# Patient Record
Sex: Female | Born: 1945
Health system: Southern US, Community
[De-identification: ages and names within clinical notes are randomized; demographics above are authoritative.]

## PROBLEM LIST (undated history)

## (undated) DIAGNOSIS — Z8709 Personal history of other diseases of the respiratory system: Secondary | ICD-10-CM

## (undated) DIAGNOSIS — K219 Gastro-esophageal reflux disease without esophagitis: Secondary | ICD-10-CM

## (undated) DIAGNOSIS — Z860100 Personal history of colon polyps, unspecified: Secondary | ICD-10-CM

## (undated) DIAGNOSIS — M1990 Unspecified osteoarthritis, unspecified site: Secondary | ICD-10-CM

## (undated) DIAGNOSIS — R928 Other abnormal and inconclusive findings on diagnostic imaging of breast: Secondary | ICD-10-CM

## (undated) DIAGNOSIS — Z87442 Personal history of urinary calculi: Secondary | ICD-10-CM

## (undated) DIAGNOSIS — K602 Anal fissure, unspecified: Secondary | ICD-10-CM

## (undated) HISTORY — PX: CYSTOSCOPY W/ STONE MANIPULATION: SHX1427

## (undated) HISTORY — PX: SOFT TISSUE BIOPSY: SHX1053

## (undated) HISTORY — PX: BREAST EXCISIONAL BIOPSY: SUR124

---

## 1997-09-06 ENCOUNTER — Other Ambulatory Visit: Admission: RE | Admit: 1997-09-06 | Discharge: 1997-09-06 | Payer: Self-pay | Admitting: Obstetrics and Gynecology

## 1998-11-08 ENCOUNTER — Other Ambulatory Visit: Admission: RE | Admit: 1998-11-08 | Discharge: 1998-11-08 | Payer: Self-pay | Admitting: Obstetrics and Gynecology

## 1998-11-28 ENCOUNTER — Ambulatory Visit (HOSPITAL_BASED_OUTPATIENT_CLINIC_OR_DEPARTMENT_OTHER): Admission: RE | Admit: 1998-11-28 | Discharge: 1998-11-28 | Payer: Self-pay | Admitting: Surgery

## 1999-12-13 ENCOUNTER — Other Ambulatory Visit: Admission: RE | Admit: 1999-12-13 | Discharge: 1999-12-13 | Payer: Self-pay | Admitting: Obstetrics and Gynecology

## 2001-01-05 ENCOUNTER — Encounter: Admission: RE | Admit: 2001-01-05 | Discharge: 2001-01-05 | Payer: Self-pay | Admitting: Obstetrics and Gynecology

## 2001-01-05 ENCOUNTER — Encounter: Payer: Self-pay | Admitting: Obstetrics and Gynecology

## 2004-01-15 ENCOUNTER — Other Ambulatory Visit: Admission: RE | Admit: 2004-01-15 | Discharge: 2004-01-15 | Payer: Self-pay | Admitting: Obstetrics and Gynecology

## 2004-01-24 ENCOUNTER — Encounter: Admission: RE | Admit: 2004-01-24 | Discharge: 2004-01-24 | Payer: Self-pay | Admitting: Obstetrics and Gynecology

## 2004-09-05 ENCOUNTER — Encounter: Admission: RE | Admit: 2004-09-05 | Discharge: 2004-09-05 | Payer: Self-pay | Admitting: Obstetrics and Gynecology

## 2005-02-21 ENCOUNTER — Other Ambulatory Visit
Admission: RE | Admit: 2005-02-21 | Discharge: 2005-02-21 | Payer: Self-pay | Admitting: Physical Medicine & Rehabilitation

## 2005-10-07 ENCOUNTER — Encounter: Admission: RE | Admit: 2005-10-07 | Discharge: 2005-10-07 | Payer: Self-pay | Admitting: Obstetrics and Gynecology

## 2005-10-28 ENCOUNTER — Encounter: Admission: RE | Admit: 2005-10-28 | Discharge: 2005-10-28 | Payer: Self-pay | Admitting: Obstetrics and Gynecology

## 2006-02-25 ENCOUNTER — Other Ambulatory Visit: Admission: RE | Admit: 2006-02-25 | Discharge: 2006-02-25 | Payer: Self-pay | Admitting: Obstetrics and Gynecology

## 2006-05-04 ENCOUNTER — Encounter: Admission: RE | Admit: 2006-05-04 | Discharge: 2006-05-04 | Payer: Self-pay | Admitting: Obstetrics and Gynecology

## 2006-10-14 ENCOUNTER — Encounter: Admission: RE | Admit: 2006-10-14 | Discharge: 2006-10-14 | Payer: Self-pay | Admitting: Obstetrics and Gynecology

## 2007-03-02 ENCOUNTER — Other Ambulatory Visit: Admission: RE | Admit: 2007-03-02 | Discharge: 2007-03-02 | Payer: Self-pay | Admitting: Obstetrics and Gynecology

## 2007-10-26 ENCOUNTER — Encounter: Admission: RE | Admit: 2007-10-26 | Discharge: 2007-10-26 | Payer: Self-pay | Admitting: Obstetrics and Gynecology

## 2007-11-03 ENCOUNTER — Encounter: Admission: RE | Admit: 2007-11-03 | Discharge: 2007-11-03 | Payer: Self-pay | Admitting: Obstetrics and Gynecology

## 2008-03-02 ENCOUNTER — Other Ambulatory Visit: Admission: RE | Admit: 2008-03-02 | Discharge: 2008-03-02 | Payer: Self-pay | Admitting: Obstetrics and Gynecology

## 2008-10-30 ENCOUNTER — Encounter: Admission: RE | Admit: 2008-10-30 | Discharge: 2008-10-30 | Payer: Self-pay | Admitting: Obstetrics and Gynecology

## 2009-03-05 ENCOUNTER — Other Ambulatory Visit: Admission: RE | Admit: 2009-03-05 | Discharge: 2009-03-05 | Payer: Self-pay | Admitting: Obstetrics and Gynecology

## 2009-04-26 ENCOUNTER — Ambulatory Visit (HOSPITAL_COMMUNITY): Admission: RE | Admit: 2009-04-26 | Discharge: 2009-04-26 | Payer: Self-pay | Admitting: Gastroenterology

## 2009-07-20 ENCOUNTER — Ambulatory Visit: Payer: Self-pay | Admitting: Diagnostic Radiology

## 2009-07-20 ENCOUNTER — Emergency Department (HOSPITAL_BASED_OUTPATIENT_CLINIC_OR_DEPARTMENT_OTHER): Admission: EM | Admit: 2009-07-20 | Discharge: 2009-07-20 | Payer: Self-pay | Admitting: Emergency Medicine

## 2009-07-21 ENCOUNTER — Emergency Department (HOSPITAL_BASED_OUTPATIENT_CLINIC_OR_DEPARTMENT_OTHER): Admission: EM | Admit: 2009-07-21 | Discharge: 2009-07-21 | Payer: Self-pay | Admitting: Emergency Medicine

## 2009-07-22 ENCOUNTER — Inpatient Hospital Stay (HOSPITAL_COMMUNITY): Admission: EM | Admit: 2009-07-22 | Discharge: 2009-07-24 | Payer: Self-pay | Admitting: Emergency Medicine

## 2009-12-03 ENCOUNTER — Encounter: Admission: RE | Admit: 2009-12-03 | Discharge: 2009-12-03 | Payer: Self-pay | Admitting: Internal Medicine

## 2010-03-22 ENCOUNTER — Other Ambulatory Visit
Admission: RE | Admit: 2010-03-22 | Discharge: 2010-03-22 | Payer: Self-pay | Source: Home / Self Care | Admitting: Obstetrics and Gynecology

## 2010-05-13 ENCOUNTER — Encounter: Payer: Self-pay | Admitting: Obstetrics and Gynecology

## 2010-07-10 LAB — DIFFERENTIAL
Basophils Absolute: 0 10*3/uL (ref 0.0–0.1)
Basophils Absolute: 0 10*3/uL (ref 0.0–0.1)
Basophils Relative: 0 % (ref 0–1)
Eosinophils Absolute: 0 10*3/uL (ref 0.0–0.7)
Eosinophils Absolute: 0 10*3/uL (ref 0.0–0.7)
Eosinophils Relative: 0 % (ref 0–5)
Eosinophils Relative: 0 % (ref 0–5)
Eosinophils Relative: 0 % (ref 0–5)
Lymphocytes Relative: 10 % — ABNORMAL LOW (ref 12–46)
Lymphocytes Relative: 15 % (ref 12–46)
Lymphs Abs: 0.6 10*3/uL — ABNORMAL LOW (ref 0.7–4.0)
Lymphs Abs: 1.3 10*3/uL (ref 0.7–4.0)
Lymphs Abs: 1.5 10*3/uL (ref 0.7–4.0)
Monocytes Absolute: 0.7 10*3/uL (ref 0.1–1.0)
Monocytes Absolute: 1 10*3/uL (ref 0.1–1.0)
Monocytes Absolute: 0.5 10*3/uL (ref 0.1–1.0)
Monocytes Relative: 8 % (ref 3–12)
Monocytes Relative: 5 % (ref 3–12)
Neutro Abs: 10.4 10*3/uL — ABNORMAL HIGH (ref 1.7–7.7)
Neutro Abs: 7.7 10*3/uL (ref 1.7–7.7)
Neutrophils Relative %: 82 % — ABNORMAL HIGH (ref 43–77)

## 2010-07-10 LAB — COMPREHENSIVE METABOLIC PANEL
AST: 23 U/L (ref 0–37)
Albumin: 4 g/dL (ref 3.5–5.2)
BUN: 16 mg/dL (ref 6–23)
Calcium: 9.6 mg/dL (ref 8.4–10.5)
Chloride: 106 mEq/L (ref 96–112)
Creatinine, Ser: 0.8 mg/dL (ref 0.4–1.2)
GFR calc Af Amer: >60 mL/min (ref >60)
Total Protein: 7 g/dL (ref 6.0–8.3)

## 2010-07-10 LAB — BASIC METABOLIC PANEL
BUN: 12 mg/dL (ref 6–23)
BUN: 17 mg/dL (ref 6–23)
CO2: 25 mEq/L (ref 19–32)
CO2: 25 mEq/L (ref 19–32)
Calcium: 8.5 mg/dL (ref 8.4–10.5)
Calcium: 8.7 mg/dL (ref 8.4–10.5)
Chloride: 103 mEq/L (ref 96–112)
Chloride: 109 mEq/L (ref 96–112)
Creatinine, Ser: 1.26 mg/dL — ABNORMAL HIGH (ref 0.4–1.2)
Creatinine, Ser: 1.3 mg/dL — ABNORMAL HIGH (ref 0.4–1.2)
GFR calc Af Amer: 50 mL/min — ABNORMAL LOW (ref >60)
GFR calc Af Amer: 52 mL/min — ABNORMAL LOW (ref >60)
GFR calc non Af Amer: 41 mL/min — ABNORMAL LOW (ref >60)
GFR calc non Af Amer: 43 mL/min — ABNORMAL LOW (ref >60)
Glucose, Bld: 116 mg/dL — ABNORMAL HIGH (ref 70–99)
Glucose, Bld: 145 mg/dL — ABNORMAL HIGH (ref 70–99)
Potassium: 4 mEq/L (ref 3.5–5.1)
Potassium: 4.1 mEq/L (ref 3.5–5.1)
Sodium: 133 mEq/L — ABNORMAL LOW (ref 135–145)
Sodium: 138 mEq/L (ref 135–145)

## 2010-07-10 LAB — URINALYSIS, ROUTINE W REFLEX MICROSCOPIC
Bilirubin Urine: NEGATIVE
Bilirubin Urine: NEGATIVE
Glucose, UA: NEGATIVE mg/dL
Glucose, UA: NEGATIVE mg/dL
Ketones, ur: 15 mg/dL — AB
Ketones, ur: NEGATIVE mg/dL
Leukocytes, UA: NEGATIVE
Nitrite: NEGATIVE
Nitrite: NEGATIVE
Protein, ur: NEGATIVE mg/dL
Specific Gravity, Urine: 1.012 (ref 1.005–1.030)
Specific Gravity, Urine: 1.039 — ABNORMAL HIGH (ref 1.005–1.030)
Urobilinogen, UA: 0.2 mg/dL (ref 0.0–1.0)
pH: 6 (ref 5.0–8.0)
pH: 6 (ref 5.0–8.0)

## 2010-07-10 LAB — CBC
HCT: 32.7 % — ABNORMAL LOW (ref 36.0–46.0)
HCT: 33.5 % — ABNORMAL LOW (ref 36.0–46.0)
HCT: 37.6 % (ref 36.0–46.0)
HCT: 39.7 % (ref 36.0–46.0)
Hemoglobin: 10.8 g/dL — ABNORMAL LOW (ref 12.0–15.0)
Hemoglobin: 11.2 g/dL — ABNORMAL LOW (ref 12.0–15.0)
Hemoglobin: 12.4 g/dL (ref 12.0–15.0)
MCHC: 33.1 g/dL (ref 30.0–36.0)
MCHC: 33.1 g/dL (ref 30.0–36.0)
MCHC: 33.8 g/dL (ref 30.0–36.0)
MCV: 93.6 fL (ref 78.0–100.0)
MCV: 93.9 fL (ref 78.0–100.0)
MCV: 94.6 fL (ref 78.0–100.0)
MCV: 91.7 fL (ref 78.0–100.0)
Platelets: 143 10*3/uL — ABNORMAL LOW (ref 150–400)
Platelets: 150 10*3/uL (ref 150–400)
Platelets: 173 10*3/uL (ref 150–400)
Platelets: 232 10*3/uL (ref 150–400)
RBC: 3.49 MIL/uL — ABNORMAL LOW (ref 3.87–5.11)
RBC: 3.98 MIL/uL (ref 3.87–5.11)
RDW: 13.2 % (ref 11.5–15.5)
RDW: 13.5 % (ref 11.5–15.5)
RDW: 13.6 % (ref 11.5–15.5)
RDW: 13 % (ref 11.5–15.5)
WBC: 12.7 10*3/uL — ABNORMAL HIGH (ref 4.0–10.5)
WBC: 8.8 10*3/uL (ref 4.0–10.5)
WBC: 9.5 10*3/uL (ref 4.0–10.5)
WBC: 9.7 10*3/uL (ref 4.0–10.5)

## 2010-07-10 LAB — URINE MICROSCOPIC-ADD ON

## 2010-07-10 LAB — CREATININE, SERUM
Creatinine, Ser: 1.14 mg/dL (ref 0.4–1.2)
GFR calc Af Amer: 58 mL/min — ABNORMAL LOW (ref >60)
GFR calc non Af Amer: 48 mL/min — ABNORMAL LOW (ref >60)

## 2010-07-10 LAB — URINE CULTURE: Colony Count: NO GROWTH

## 2010-12-06 ENCOUNTER — Other Ambulatory Visit: Payer: Self-pay | Admitting: Obstetrics and Gynecology

## 2010-12-06 DIAGNOSIS — Z1231 Encounter for screening mammogram for malignant neoplasm of breast: Secondary | ICD-10-CM

## 2010-12-16 ENCOUNTER — Ambulatory Visit
Admission: RE | Admit: 2010-12-16 | Discharge: 2010-12-16 | Disposition: A | Discharge status: Home / Self Care | Payer: BC Managed Care – PPO | Source: Ambulatory Visit | Attending: Obstetrics and Gynecology | Admitting: Obstetrics and Gynecology

## 2010-12-16 DIAGNOSIS — Z1231 Encounter for screening mammogram for malignant neoplasm of breast: Secondary | ICD-10-CM

## 2012-01-16 ENCOUNTER — Other Ambulatory Visit: Payer: Self-pay | Admitting: Internal Medicine

## 2012-01-16 DIAGNOSIS — Z1231 Encounter for screening mammogram for malignant neoplasm of breast: Secondary | ICD-10-CM

## 2012-02-10 ENCOUNTER — Ambulatory Visit
Admission: RE | Admit: 2012-02-10 | Discharge: 2012-02-10 | Disposition: A | Discharge status: Home / Self Care | Payer: Medicare Other | Source: Ambulatory Visit | Attending: Internal Medicine | Admitting: Internal Medicine

## 2012-02-10 DIAGNOSIS — Z1231 Encounter for screening mammogram for malignant neoplasm of breast: Secondary | ICD-10-CM

## 2013-01-03 ENCOUNTER — Other Ambulatory Visit: Payer: Self-pay

## 2013-01-03 DIAGNOSIS — Z1231 Encounter for screening mammogram for malignant neoplasm of breast: Secondary | ICD-10-CM

## 2013-02-14 ENCOUNTER — Inpatient Hospital Stay: Admission: RE | Admit: 2013-02-14 | Payer: Medicare Other | Source: Ambulatory Visit

## 2013-03-21 ENCOUNTER — Ambulatory Visit
Admission: RE | Admit: 2013-03-21 | Discharge: 2013-03-21 | Disposition: A | Discharge status: Home / Self Care | Payer: Medicare Other | Source: Ambulatory Visit

## 2013-03-21 DIAGNOSIS — Z1231 Encounter for screening mammogram for malignant neoplasm of breast: Secondary | ICD-10-CM

## 2014-02-16 ENCOUNTER — Other Ambulatory Visit: Payer: Self-pay

## 2014-02-16 DIAGNOSIS — Z1231 Encounter for screening mammogram for malignant neoplasm of breast: Secondary | ICD-10-CM

## 2014-03-27 ENCOUNTER — Ambulatory Visit
Admission: RE | Admit: 2014-03-27 | Discharge: 2014-03-27 | Disposition: A | Discharge status: Home / Self Care | Payer: Medicare Other | Source: Ambulatory Visit

## 2014-03-27 ENCOUNTER — Encounter (INDEPENDENT_AMBULATORY_CARE_PROVIDER_SITE_OTHER): Payer: Self-pay

## 2014-03-27 DIAGNOSIS — Z1231 Encounter for screening mammogram for malignant neoplasm of breast: Secondary | ICD-10-CM

## 2014-09-21 ENCOUNTER — Other Ambulatory Visit: Payer: Self-pay | Admitting: Gastroenterology

## 2014-12-18 ENCOUNTER — Encounter (HOSPITAL_COMMUNITY): Payer: Self-pay | Admitting: *Deleted

## 2015-01-02 ENCOUNTER — Ambulatory Visit (HOSPITAL_COMMUNITY): Payer: PPO | Admitting: Anesthesiology

## 2015-01-02 ENCOUNTER — Ambulatory Visit (HOSPITAL_COMMUNITY)
Admission: RE | Admit: 2015-01-02 | Discharge: 2015-01-02 | Disposition: A | Discharge status: Home / Self Care | Payer: PPO | Source: Ambulatory Visit | Attending: Gastroenterology | Admitting: Gastroenterology

## 2015-01-02 ENCOUNTER — Encounter (HOSPITAL_COMMUNITY)
Admission: RE | Disposition: A | Discharge status: Home / Self Care | Payer: Self-pay | Source: Ambulatory Visit | Attending: Gastroenterology

## 2015-01-02 ENCOUNTER — Encounter (HOSPITAL_COMMUNITY): Payer: Self-pay

## 2015-01-02 DIAGNOSIS — K602 Anal fissure, unspecified: Secondary | ICD-10-CM | POA: Diagnosis not present

## 2015-01-02 DIAGNOSIS — K219 Gastro-esophageal reflux disease without esophagitis: Secondary | ICD-10-CM | POA: Insufficient documentation

## 2015-01-02 DIAGNOSIS — Z1211 Encounter for screening for malignant neoplasm of colon: Secondary | ICD-10-CM | POA: Diagnosis not present

## 2015-01-02 DIAGNOSIS — Z86010 Personal history of colonic polyps: Secondary | ICD-10-CM | POA: Insufficient documentation

## 2015-01-02 DIAGNOSIS — D123 Benign neoplasm of transverse colon: Secondary | ICD-10-CM | POA: Diagnosis not present

## 2015-01-02 HISTORY — PX: COLONOSCOPY WITH PROPOFOL: SHX5780

## 2015-01-02 HISTORY — DX: Personal history of urinary calculi: Z87.442

## 2015-01-02 SURGERY — COLONOSCOPY WITH PROPOFOL
Anesthesia: Monitor Anesthesia Care

## 2015-01-02 MED ORDER — PROPOFOL 10 MG/ML IV BOLUS
INTRAVENOUS | Status: DC | PRN
  Administered 2015-01-02 (×2): 20 mg via INTRAVENOUS

## 2015-01-02 MED ORDER — LIDOCAINE HCL (CARDIAC) 20 MG/ML IV SOLN
INTRAVENOUS | Status: AC
  Filled 2015-01-02: qty 5

## 2015-01-02 MED ORDER — PROPOFOL 10 MG/ML IV BOLUS
INTRAVENOUS | Status: AC
Start: 2015-01-02 — End: 2015-01-02
  Filled 2015-01-02: qty 20

## 2015-01-02 MED ORDER — LIDOCAINE HCL (CARDIAC) 20 MG/ML IV SOLN
INTRAVENOUS | Status: DC | PRN
  Administered 2015-01-02: 100 mg via INTRAVENOUS

## 2015-01-02 MED ORDER — PROPOFOL 10 MG/ML IV BOLUS
INTRAVENOUS | Status: AC
  Filled 2015-01-02: qty 20

## 2015-01-02 MED ORDER — SODIUM CHLORIDE 0.9 % IV SOLN: INTRAVENOUS | Status: DC

## 2015-01-02 MED ORDER — LACTATED RINGERS IV SOLN
INTRAVENOUS | Status: DC | PRN
  Administered 2015-01-02: 12:00:00 via INTRAVENOUS

## 2015-01-02 MED ORDER — PROPOFOL INFUSION 10 MG/ML OPTIME
INTRAVENOUS | Status: DC | PRN
  Administered 2015-01-02: 130 ug/kg/min via INTRAVENOUS

## 2015-01-02 SURGICAL SUPPLY — 22 items

## 2015-01-02 NOTE — Anesthesia Preprocedure Evaluation (Addendum)
Anesthesia Evaluation  Patient identified by MRN, date of birth, ID band Patient awake    Reviewed: Allergy & Precautions, H&P , NPO status , Patient's Chart, lab work & pertinent test results  Airway Mallampati: II  TM Distance: >3 FB Neck ROM: full    Dental  (+) Dental Advisory Given, Caps All upper side teeth are capped:   Pulmonary neg pulmonary ROS,    Pulmonary exam normal breath sounds clear to auscultation       Cardiovascular Exercise Tolerance: Good negative cardio ROS Normal cardiovascular exam Rhythm:regular Rate:Normal     Neuro/Psych negative neurological ROS  negative psych ROS   GI/Hepatic negative GI ROS, Neg liver ROS,   Endo/Other  negative endocrine ROS  Renal/GU negative Renal ROS  negative genitourinary   Musculoskeletal   Abdominal   Peds  Hematology negative hematology ROS (+)   Anesthesia Other Findings   Reproductive/Obstetrics negative OB ROS                            Anesthesia Physical Anesthesia Plan  ASA: I  Anesthesia Plan: MAC   Post-op Pain Management:    Induction:   Airway Management Planned: Natural Airway and Simple Face Mask  Additional Equipment:   Intra-op Plan:   Post-operative Plan:   Informed Consent: I have reviewed the patients History and Physical, chart, labs and discussed the procedure including the risks, benefits and alternatives for the proposed anesthesia with the patient or authorized representative who has indicated his/her understanding and acceptance.   Dental Advisory Given  Plan Discussed with: CRNA and Surgeon  Anesthesia Plan Comments:        Anesthesia Quick Evaluation

## 2015-01-02 NOTE — Discharge Instructions (Signed)

## 2015-01-02 NOTE — Anesthesia Postprocedure Evaluation (Signed)
  Anesthesia Post-op Note  Patient: Stacy King  Procedure(s) Performed: Procedure(s) (LRB): COLONOSCOPY WITH PROPOFOL (N/A)  Patient Location: PACU  Anesthesia Type: MAC  Level of Consciousness: awake and alert   Airway and Oxygen Therapy: Patient Spontanous Breathing  Post-op Pain: mild  Post-op Assessment: Post-op Vital signs reviewed, Patient's Cardiovascular Status Stable, Respiratory Function Stable, Patent Airway and No signs of Nausea or vomiting  Last Vitals:  Filed Vitals:   01/02/15 1400  BP: 137/73  Pulse: 59  Temp:   Resp: 17    Post-op Vital Signs: stable   Complications: No apparent anesthesia complications

## 2015-01-02 NOTE — Op Note (Signed)
Procedure: Surveillance colonoscopy. 04/26/2009 colonoscopy performed with removal of a 4 mm adenomatous ascending colon polyp  Endoscopist: Earle Gell  Premedication: Propofol administered by anesthesia  Procedure: The patient was placed in the left lateral decubitus position. Anal inspection revealed a diminutive posterior anal fissure. Digital rectal exam was normal. The Pentax pediatric colonoscope was introduced into the rectum and advanced to the cecum. A normal-appearing appendiceal orifice was identified. A normal-appearing ileocecal valve was intubated and the terminal ileum inspected. Colonic preparation for the exam today was good. Withdrawal time was 10 minutes  Rectum. Normal. Retroflex view of the distal rectum was normal  Sigmoid colon and descending colon. Normal  Splenic flexure. Normal  Transverse colon. From the proximal transverse colon, a 4 mm sessile polyp was removed with the cold snare  Ascending colon. Normal.  Cecum and ileocecal valve. Normal  Terminal ileum. Normal  Assessment: From the proximal transverse colon, a 4 mm sessile polyp was removed with the cold snare. Otherwise normal surveillance colonoscopy  Recommendation: If the proximal transverse colon polyp returns adenomatous pathologically, schedule a repeat colonoscopy in 5 years

## 2015-01-02 NOTE — Transfer of Care (Signed)
Immediate Anesthesia Transfer of Care Note  Patient: Stacy King  Procedure(s) Performed: Procedure(s): COLONOSCOPY WITH PROPOFOL (N/A)  Patient Location: PACU and Endoscopy Unit  Anesthesia Type:MAC  Level of Consciousness: awake, alert , oriented and patient cooperative  Airway & Oxygen Therapy: Patient Spontanous Breathing and Patient connected to face mask oxygen  Post-op Assessment: Report given to RN, Post -op Vital signs reviewed and stable and Patient moving all extremities  Post vital signs: Reviewed and stable  Last Vitals:  Filed Vitals:   01/02/15 1155  BP: 133/73  Pulse: 61  Temp: 36.6 C  Resp: 13    Complications: No apparent anesthesia complications

## 2015-01-02 NOTE — H&P (Signed)
  Procedure: Surveillance colonoscopy. 04/26/2009 colonoscopy performed with removal of a 4 mm adenomatous ascending colon polyp  History: The patient is a 69 year old female born/05/1945. She is scheduled to undergo a surveillance colonoscopy today.  Past medical history: Migraine headache syndrome. Anal fissure. Fibrocystic breast disease. Gastroesophageal reflux. Kidney stone. D&C. Breast cyst removed.  Medication allergies: Sulfa drugs and ciprofloxacin  Exam: The patient is alert and lying comfortably on the endoscopy stretcher. Abdomen is soft and nontender to palpation. Lungs are clear to auscultation. Cardiac exam reveals a regular rhythm.  Plan: Proceed with surveillance colonoscopy

## 2015-01-03 ENCOUNTER — Encounter (HOSPITAL_COMMUNITY): Payer: Self-pay | Admitting: Gastroenterology

## 2015-02-26 ENCOUNTER — Other Ambulatory Visit: Payer: Self-pay

## 2015-02-26 DIAGNOSIS — Z1231 Encounter for screening mammogram for malignant neoplasm of breast: Secondary | ICD-10-CM

## 2015-04-02 ENCOUNTER — Ambulatory Visit
Admission: RE | Admit: 2015-04-02 | Discharge: 2015-04-02 | Disposition: A | Discharge status: Home / Self Care | Payer: PPO | Source: Ambulatory Visit

## 2015-04-02 DIAGNOSIS — Z1231 Encounter for screening mammogram for malignant neoplasm of breast: Secondary | ICD-10-CM

## 2015-09-06 DIAGNOSIS — R05 Cough: Secondary | ICD-10-CM | POA: Diagnosis not present

## 2015-09-19 DIAGNOSIS — M65311 Trigger thumb, right thumb: Secondary | ICD-10-CM | POA: Diagnosis not present

## 2015-10-12 DIAGNOSIS — H52223 Regular astigmatism, bilateral: Secondary | ICD-10-CM | POA: Diagnosis not present

## 2015-10-12 DIAGNOSIS — H524 Presbyopia: Secondary | ICD-10-CM | POA: Diagnosis not present

## 2015-10-12 DIAGNOSIS — H5203 Hypermetropia, bilateral: Secondary | ICD-10-CM | POA: Diagnosis not present

## 2015-10-12 DIAGNOSIS — H25813 Combined forms of age-related cataract, bilateral: Secondary | ICD-10-CM | POA: Diagnosis not present

## 2015-10-12 DIAGNOSIS — H1851 Endothelial corneal dystrophy: Secondary | ICD-10-CM | POA: Diagnosis not present

## 2015-11-07 DIAGNOSIS — M65311 Trigger thumb, right thumb: Secondary | ICD-10-CM | POA: Diagnosis not present

## 2015-11-07 DIAGNOSIS — M79644 Pain in right finger(s): Secondary | ICD-10-CM | POA: Diagnosis not present

## 2015-11-08 DIAGNOSIS — H2513 Age-related nuclear cataract, bilateral: Secondary | ICD-10-CM | POA: Diagnosis not present

## 2015-11-08 DIAGNOSIS — H5702 Anisocoria: Secondary | ICD-10-CM | POA: Diagnosis not present

## 2016-02-05 DIAGNOSIS — E559 Vitamin D deficiency, unspecified: Secondary | ICD-10-CM | POA: Diagnosis not present

## 2016-02-05 DIAGNOSIS — Z23 Encounter for immunization: Secondary | ICD-10-CM | POA: Diagnosis not present

## 2016-02-05 DIAGNOSIS — D126 Benign neoplasm of colon, unspecified: Secondary | ICD-10-CM | POA: Diagnosis not present

## 2016-02-05 DIAGNOSIS — E7800 Pure hypercholesterolemia, unspecified: Secondary | ICD-10-CM | POA: Diagnosis not present

## 2016-02-05 DIAGNOSIS — K219 Gastro-esophageal reflux disease without esophagitis: Secondary | ICD-10-CM | POA: Diagnosis not present

## 2016-02-21 DIAGNOSIS — L820 Inflamed seborrheic keratosis: Secondary | ICD-10-CM | POA: Diagnosis not present

## 2016-02-22 ENCOUNTER — Other Ambulatory Visit: Payer: Self-pay | Admitting: Obstetrics and Gynecology

## 2016-02-22 DIAGNOSIS — Z1231 Encounter for screening mammogram for malignant neoplasm of breast: Secondary | ICD-10-CM

## 2016-03-10 DIAGNOSIS — Z0000 Encounter for general adult medical examination without abnormal findings: Secondary | ICD-10-CM | POA: Diagnosis not present

## 2016-03-10 DIAGNOSIS — Z1389 Encounter for screening for other disorder: Secondary | ICD-10-CM | POA: Diagnosis not present

## 2016-03-10 DIAGNOSIS — E7800 Pure hypercholesterolemia, unspecified: Secondary | ICD-10-CM | POA: Diagnosis not present

## 2016-04-02 ENCOUNTER — Ambulatory Visit
Admission: RE | Admit: 2016-04-02 | Discharge: 2016-04-02 | Disposition: A | Discharge status: Home / Self Care | Payer: PPO | Source: Ambulatory Visit | Attending: Obstetrics and Gynecology | Admitting: Obstetrics and Gynecology

## 2016-04-02 DIAGNOSIS — Z1231 Encounter for screening mammogram for malignant neoplasm of breast: Secondary | ICD-10-CM | POA: Diagnosis not present

## 2016-04-04 ENCOUNTER — Other Ambulatory Visit: Payer: Self-pay | Admitting: Obstetrics and Gynecology

## 2016-04-04 DIAGNOSIS — R928 Other abnormal and inconclusive findings on diagnostic imaging of breast: Secondary | ICD-10-CM

## 2016-04-21 HISTORY — PX: BREAST BIOPSY: SHX20

## 2016-04-21 HISTORY — PX: BREAST EXCISIONAL BIOPSY: SUR124

## 2016-04-29 ENCOUNTER — Ambulatory Visit
Admission: RE | Admit: 2016-04-29 | Discharge: 2016-04-29 | Disposition: A | Discharge status: Home / Self Care | Payer: PPO | Source: Ambulatory Visit | Attending: Obstetrics and Gynecology | Admitting: Obstetrics and Gynecology

## 2016-04-29 ENCOUNTER — Other Ambulatory Visit: Payer: Self-pay | Admitting: Obstetrics and Gynecology

## 2016-04-29 DIAGNOSIS — R928 Other abnormal and inconclusive findings on diagnostic imaging of breast: Secondary | ICD-10-CM

## 2016-04-29 DIAGNOSIS — R922 Inconclusive mammogram: Secondary | ICD-10-CM | POA: Diagnosis not present

## 2016-04-29 DIAGNOSIS — N6489 Other specified disorders of breast: Secondary | ICD-10-CM | POA: Diagnosis not present

## 2016-05-02 ENCOUNTER — Emergency Department (HOSPITAL_BASED_OUTPATIENT_CLINIC_OR_DEPARTMENT_OTHER)
Admission: EM | Admit: 2016-05-02 | Discharge: 2016-05-02 | Disposition: A | Discharge status: Home / Self Care | Payer: PPO | Attending: Emergency Medicine | Admitting: Emergency Medicine

## 2016-05-02 ENCOUNTER — Other Ambulatory Visit: Payer: Self-pay | Admitting: Obstetrics and Gynecology

## 2016-05-02 ENCOUNTER — Encounter (HOSPITAL_BASED_OUTPATIENT_CLINIC_OR_DEPARTMENT_OTHER): Payer: Self-pay | Admitting: *Deleted

## 2016-05-02 ENCOUNTER — Ambulatory Visit
Admission: RE | Admit: 2016-05-02 | Discharge: 2016-05-02 | Disposition: A | Discharge status: Home / Self Care | Payer: PPO | Source: Ambulatory Visit | Attending: Obstetrics and Gynecology | Admitting: Obstetrics and Gynecology

## 2016-05-02 DIAGNOSIS — R1111 Vomiting without nausea: Secondary | ICD-10-CM | POA: Diagnosis not present

## 2016-05-02 DIAGNOSIS — R55 Syncope and collapse: Secondary | ICD-10-CM | POA: Insufficient documentation

## 2016-05-02 DIAGNOSIS — R112 Nausea with vomiting, unspecified: Secondary | ICD-10-CM | POA: Diagnosis not present

## 2016-05-02 DIAGNOSIS — R928 Other abnormal and inconclusive findings on diagnostic imaging of breast: Secondary | ICD-10-CM

## 2016-05-02 DIAGNOSIS — N6489 Other specified disorders of breast: Secondary | ICD-10-CM

## 2016-05-02 DIAGNOSIS — R42 Dizziness and giddiness: Secondary | ICD-10-CM | POA: Diagnosis not present

## 2016-05-02 DIAGNOSIS — R404 Transient alteration of awareness: Secondary | ICD-10-CM | POA: Diagnosis not present

## 2016-05-02 DIAGNOSIS — N6011 Diffuse cystic mastopathy of right breast: Secondary | ICD-10-CM | POA: Diagnosis not present

## 2016-05-02 LAB — CBC WITH DIFFERENTIAL/PLATELET
BASOS ABS: 0 10*3/uL (ref 0.0–0.1)
BASOS PCT: 0 %
Eosinophils Absolute: 0 10*3/uL (ref 0.0–0.7)
Eosinophils Relative: 0 %
HEMATOCRIT: 43.1 % (ref 36.0–46.0)
Hemoglobin: 13.7 g/dL (ref 12.0–15.0)
LYMPHS PCT: 16 %
Lymphs Abs: 1.4 10*3/uL (ref 0.7–4.0)
MCH: 29.1 pg (ref 26.0–34.0)
MCHC: 31.8 g/dL (ref 30.0–36.0)
MCV: 91.5 fL (ref 78.0–100.0)
Monocytes Absolute: 0.4 10*3/uL (ref 0.1–1.0)
Monocytes Relative: 5 %
NEUTROS ABS: 6.7 10*3/uL (ref 1.7–7.7)
NEUTROS PCT: 79 %
Platelets: 206 10*3/uL (ref 150–400)
RBC: 4.71 MIL/uL (ref 3.87–5.11)
RDW: 13.1 % (ref 11.5–15.5)
WBC: 8.5 10*3/uL (ref 4.0–10.5)

## 2016-05-02 LAB — BASIC METABOLIC PANEL
ANION GAP: 5 (ref 5–15)
BUN: 17 mg/dL (ref 6–20)
CO2: 25 mmol/L (ref 22–32)
Calcium: 8.4 mg/dL — ABNORMAL LOW (ref 8.9–10.3)
Chloride: 110 mmol/L (ref 101–111)
Creatinine, Ser: 0.6 mg/dL (ref 0.44–1.00)
Glucose, Bld: 120 mg/dL — ABNORMAL HIGH (ref 65–99)
POTASSIUM: 3.9 mmol/L (ref 3.5–5.1)
SODIUM: 140 mmol/L (ref 135–145)

## 2016-05-02 LAB — CBG MONITORING, ED: Glucose-Capillary: 127 mg/dL — ABNORMAL HIGH (ref 65–99)

## 2016-05-02 MED ORDER — LACTATED RINGERS IV BOLUS (SEPSIS)
1000.0000 mL | Freq: Once | INTRAVENOUS | Status: AC
  Administered 2016-05-02: 1000 mL via INTRAVENOUS

## 2016-05-02 NOTE — Discharge Planning (Signed)
Discharge order placed. EDCM reviewed chart for possible CM needs.  No needs identified or communicated.  

## 2016-05-02 NOTE — ED Triage Notes (Signed)
Arrived via [facility] from breast center had biopsy on right breast. Had syncopal episode with vomiting and became diaphoretic x 1 hour. EMS started an IV and gave 4 mg zofran.

## 2016-05-02 NOTE — ED Notes (Signed)
Accu check done is 127.

## 2016-05-02 NOTE — ED Provider Notes (Signed)
[facility] DEPT Provider Note   CSN: [id number] Arrival date & time: 05/02/16 1145     History    Chief Complaint  Patient presents with  . Loss of Consciousness  . Emesis     HPI Stacy King is a 71 y.o. female.  71yo healthy female who p/w syncope and vomiting. The patient had a right breast biopsy this morning and was sitting up during the procedure. Towards the end of the procedure, she started feeling lightheaded, sweaty, and weak. She had a brief syncopal episode and then woke up and started having nausea and vomiting. They laid her back down and tried to give her ice and fluids but she continued to have lightheadedness and nausea/vomiting when she sat up, so they sent her here for further evaluation. She received Zofran in transport and denies any nausea currently. She denies any complaints of pain. Specifically, no chest pain or shortness of breath. She felt well this morning and denies any fevers, cough/cold symptoms, or recent illness. She denies any medical problems. She states that she has had episodes of syncope in the past related to seeing her own blood.   Past Medical History:  Diagnosis Date  . History of kidney stones    x1     There are no active problems to display for this patient.   Past Surgical History:  Procedure Laterality Date  . CESAREAN SECTION     x1  . COLONOSCOPY WITH PROPOFOL N/A 01/02/2015   Procedure: COLONOSCOPY WITH PROPOFOL;  Surgeon: Garlan Fair, MD;  Location: [facility];  Service: Endoscopy;  Laterality: N/A;  . CYSTOSCOPY W/ STONE MANIPULATION     2011  . SOFT TISSUE BIOPSY     axilla area-benign    OB History    No data available        Home Medications    Prior to Admission medications   Medication Sig Start Date End Date Taking? Authorizing Provider  Ascorbic Acid (VITAMIN C PO) Take 1 tablet by mouth every morning. + Vitamin D   Yes Historical Provider, MD  Calcium Carb-Cholecalciferol (CALTRATE 600+D)  600-800 MG-UNIT TABS Take 1 tablet by mouth every morning.   Yes Historical Provider, MD  ibuprofen (ADVIL,MOTRIN) 200 MG tablet Take 600 mg by mouth every 6 (six) hours as needed.   Yes Historical Provider, MD  KRILL OIL OMEGA-3 PO Take 2 tablets by mouth every morning.   Yes Historical Provider, MD  Magnesium 500 MG TABS Take 1 tablet by mouth every morning.   Yes Historical Provider, MD  OVER THE COUNTER MEDICATION Take 2 tablets by mouth every morning. Vital Maca    Historical Provider, MD      No family history on file.   Social History  Substance Use Topics  . Smoking status: Never Smoker  . Smokeless tobacco: Never Used  . Alcohol use No     Comment: rare may wine on social     Allergies     Patient has no known allergies.    Review of Systems  10 Systems reviewed and are negative for acute change except as noted in the HPI.   Physical Exam Updated Vital Signs BP 149/71   Pulse 76   Temp 97.4 F (36.3 C) (Oral)   Resp 15   Ht 5\' 2"  (1.575 m)   Wt 165 lb (74.8 kg)   SpO2 97%   BMI 30.18 kg/m   Physical Exam  Constitutional: She is oriented to person, place,  and time. She appears well-developed and well-nourished. No distress.  HENT:  Head: Normocephalic and atraumatic.  Moist mucous membranes  Eyes: Conjunctivae are normal.  Neck: Neck supple.  Cardiovascular: Normal rate, regular rhythm and normal heart sounds.   No murmur heard. Pulmonary/Chest: Effort normal and breath sounds normal.  Abdominal: Soft. Bowel sounds are normal. She exhibits no distension. There is no tenderness.  Musculoskeletal: She exhibits no edema.  Neurological: She is alert and oriented to person, place, and time.  Fluent speech  Skin: Skin is warm and dry.  0.5cm biopsy site R lateral breast with dried blood, no active bleeding  Psychiatric: She has a normal mood and affect. Judgment normal.  Nursing note and vitals reviewed.     ED Treatments / Results  Labs (all  labs ordered are listed, but only abnormal results are displayed) Labs Reviewed  BASIC METABOLIC PANEL - Abnormal; Notable for the following:       Result Value   Glucose, Bld 120 (*)    Calcium 8.4 (*)    All other components within normal limits  CBG MONITORING, ED - Abnormal; Notable for the following:    Glucose-Capillary 127 (*)    All other components within normal limits  CBC WITH DIFFERENTIAL/PLATELET     EKG  EKG Interpretation  Date/Time:  Friday May 02 2016 13:07:06 EST Ventricular Rate:  60 PR Interval:    QRS Duration: 97 QT Interval:  421 QTC Calculation: 421 R Axis:   75 Text Interpretation:  Sinus rhythm Nonspecific T abnormalities, anterior leads T wave inversions in V2  and v3 slightly more exaggerated than previous, no other significant changes Confirmed by Dachelle Molzahn MD, Amond Speranza [id number]) on 05/02/2016 1:20:36 PM         Radiology Mm Rt Breast Bx W Loc Dev 1st Lesion Image Bx Spec Stereo Guide  Addendum Date: 05/02/2016   ADDENDUM REPORT: 05/02/2016 10:56 ADDENDUM: Approximately 10 minutes after the procedure, the patient developed nausea and vomiting. She became diaphoretic and pale. She subsequently passed out. At the time of symptoms, she had a normal heart rate and blood pressure measured. Over the next 15 minutes, the patient regained consciousness. She was monitored in the department for approximately 75 minutes with no improvement in symptoms. The paramedics were called and the patient was sent to the emergency room via ambulance. Due to the patient's symptoms, post clip films were not obtained. She will come to the [facility] for her results and a clip film will be obtained at that time. Electronically Signed   By: Dorise Bullion III M.D   On: 05/02/2016 10:56   Result Date: 05/02/2016 CLINICAL DATA:  Right breast distortion EXAM: RIGHT BREAST STEREOTACTIC CORE NEEDLE BIOPSY COMPARISON:  Previous exams. FINDINGS: The patient and I discussed the  procedure of stereotactic-guided biopsy including benefits and alternatives. We discussed the high likelihood of a successful procedure. We discussed the risks of the procedure including infection, bleeding, tissue injury, clip migration, and inadequate sampling. Informed written consent was given. The usual time out protocol was performed immediately prior to the procedure. Using sterile technique and 1% Lidocaine as local anesthetic, under stereotactic guidance, a 9 gauge vacuum assisted device was used to perform core needle biopsy of distortion in the lateral right breast using a superior approach. At the conclusion of the procedure, a tissue marker clip was deployed into the biopsy cavity. Follow-up 2-view mammogram was performed and dictated separately. IMPRESSION: Stereotactic-guided biopsy of right breast distortion. No apparent complications.  Electronically Signed: By: Dorise Bullion III M.D On: 05/02/2016 09:22    Procedures Procedures (including critical care time) Procedures  Medications Ordered in ED  Medications  lactated ringers bolus 1,000 mL (0 mLs Intravenous Stopped 05/02/16 1405)     Initial Impression / Assessment and Plan / ED Course  I have reviewed the triage vital signs and the nursing notes.  Pertinent labs that were available during my care of the patient were reviewed by me and considered in my medical decision making (see chart for details).  Clinical Course     PT w/ syncopal episode and post-syncopal N/V at end of breast biopsy procedure today. She was awake and alert, comfortable on exam with normal vital signs. Blood glucose normal. No complaints. No active bleeding from biopsy site. Her bandages were soaked with dried blood therefore I replaced with clean Steri-Strips. EKG similar to previous. Gave IVF bolus, obtained basic labs.  Labwork unremarkable. Patient had no episodes of vomiting here, was able to tolerate liquids, and was able to ambulate with no  difficulty. She remained well appearing on repeat examination. Given that her episode of syncope was during a medical procedure and she reports previous episodes of syncope at the site of blood, I suspect vasovagal syncope. She has had no symptoms here concerning for cardiac pathology and given her overall health I feel she is safe for discharge. I discussed supportive care and extensively reviewed return precautions including any further near-syncope or syncopal episodes. Patient voiced understanding and was discharged in satisfactory condition.  Final Clinical Impressions(s) / ED Diagnoses   Final diagnoses:  Syncope, unspecified syncope type  Non-intractable vomiting with nausea, unspecified vomiting type     New Prescriptions   No medications on file       Sharlett Iles, MD 05/02/16 1525

## 2016-05-02 NOTE — Discharge Instructions (Signed)
Because you passed out during a medical procedure and your symptoms included lightheadedness, sweating, and weakness before the episode, I suspect you may have had vasovagal syncope. This type of syncope usually does not need any further workup, but if you have any further episodes of syncope or near-syncope, please seek immediate medical attention for a more extensive evaluation. Also return immediately if you have any chest pain or shortness of breath.

## 2016-05-05 ENCOUNTER — Other Ambulatory Visit: Payer: Self-pay | Admitting: Obstetrics and Gynecology

## 2016-05-05 ENCOUNTER — Ambulatory Visit
Admission: RE | Admit: 2016-05-05 | Discharge: 2016-05-05 | Disposition: A | Discharge status: Home / Self Care | Payer: PPO | Source: Ambulatory Visit | Attending: Obstetrics and Gynecology | Admitting: Obstetrics and Gynecology

## 2016-05-05 DIAGNOSIS — N6489 Other specified disorders of breast: Secondary | ICD-10-CM

## 2016-05-16 ENCOUNTER — Other Ambulatory Visit: Payer: Self-pay | Admitting: General Surgery

## 2016-05-16 DIAGNOSIS — R928 Other abnormal and inconclusive findings on diagnostic imaging of breast: Secondary | ICD-10-CM | POA: Diagnosis not present

## 2016-05-21 ENCOUNTER — Other Ambulatory Visit: Payer: Self-pay | Admitting: General Surgery

## 2016-05-21 DIAGNOSIS — R928 Other abnormal and inconclusive findings on diagnostic imaging of breast: Secondary | ICD-10-CM

## 2016-05-28 ENCOUNTER — Encounter (HOSPITAL_COMMUNITY)
Admission: RE | Admit: 2016-05-28 | Discharge: 2016-05-28 | Disposition: A | Discharge status: Home / Self Care | Payer: PPO | Source: Ambulatory Visit | Attending: General Surgery | Admitting: General Surgery

## 2016-05-28 ENCOUNTER — Encounter (HOSPITAL_COMMUNITY): Payer: Self-pay

## 2016-05-28 DIAGNOSIS — Z01818 Encounter for other preprocedural examination: Secondary | ICD-10-CM | POA: Diagnosis not present

## 2016-05-28 DIAGNOSIS — R928 Other abnormal and inconclusive findings on diagnostic imaging of breast: Secondary | ICD-10-CM | POA: Diagnosis not present

## 2016-05-28 HISTORY — DX: Unspecified osteoarthritis, unspecified site: M19.90

## 2016-05-28 HISTORY — DX: Personal history of other diseases of the respiratory system: Z87.09

## 2016-05-28 HISTORY — DX: Personal history of colonic polyps: Z86.010

## 2016-05-28 HISTORY — DX: Anal fissure, unspecified: K60.2

## 2016-05-28 HISTORY — DX: Personal history of colon polyps, unspecified: Z86.0100

## 2016-05-28 HISTORY — DX: Gastro-esophageal reflux disease without esophagitis: K21.9

## 2016-05-28 LAB — BASIC METABOLIC PANEL
ANION GAP: 9 (ref 5–15)
BUN: 13 mg/dL (ref 6–20)
CHLORIDE: 106 mmol/L (ref 101–111)
CO2: 24 mmol/L (ref 22–32)
Calcium: 9.6 mg/dL (ref 8.9–10.3)
Creatinine, Ser: 0.67 mg/dL (ref 0.44–1.00)
GFR calc non Af Amer: >60 mL/min (ref >60)
GLUCOSE: 111 mg/dL — AB (ref 65–99)
Potassium: 4 mmol/L (ref 3.5–5.1)
Sodium: 139 mmol/L (ref 135–145)

## 2016-05-28 LAB — CBC
HEMATOCRIT: 43.8 % (ref 36.0–46.0)
HEMOGLOBIN: 14.4 g/dL (ref 12.0–15.0)
MCH: 29.8 pg (ref 26.0–34.0)
MCHC: 32.9 g/dL (ref 30.0–36.0)
MCV: 90.5 fL (ref 78.0–100.0)
Platelets: 225 10*3/uL (ref 150–400)
RBC: 4.84 MIL/uL (ref 3.87–5.11)
RDW: 13 % (ref 11.5–15.5)
WBC: 7.9 10*3/uL (ref 4.0–10.5)

## 2016-05-28 MED ORDER — CHLORHEXIDINE GLUCONATE CLOTH 2 % EX PADS: 6.0000 | MEDICATED_PAD | Freq: Once | CUTANEOUS | Status: DC

## 2016-05-28 NOTE — Pre-Procedure Instructions (Signed)
Stacy King  05/28/2016      [id number] [place], [employer] [place] [address] [place] [address] Phone: [phone] Fax: [phone]    Your procedure is scheduled on Wed, Feb 13 @ 11:30 AM  Report to [facility] Admitting at 9:30 AM  Call this number if you have problems the morning of surgery:  [phone]   Remember:  Do not eat food or drink liquids after midnight.               Stop taking your Vitamins,Fish Oil,and Ibuprofen now. No Goody's,BC's,Aleve,Advil,Motrin,or Herbal Medications.    Do not wear jewelry, make-up or nail polish.  Do not wear lotions, powders,perfumes, or deoderant.  Do not shave 48 hours prior to surgery.   Do not bring valuables to the hospital.  [facility] is not responsible for any belongings or valuables.  Contacts, dentures or bridgework may not be worn into surgery.  Leave your suitcase in the car.  After surgery it may be brought to your room.  For patients admitted to the hospital, discharge time will be determined by your treatment team.  Patients discharged the day of surgery will not be allowed to drive home.    [facility]  Before surgery, you can play an important role.  Because skin is not sterile, your skin needs to be as free of germs as possible.  You can reduce the number of germs on you skin by washing with CHG (chlorahexidine gluconate) soap before surgery.  CHG is an antiseptic cleaner which kills germs and bonds with the skin to continue killing germs even after washing.  Please DO NOT use if you have an allergy to CHG or antibacterial soaps.  If your skin becomes reddened/irritated stop using the CHG and inform your nurse when you arrive at Short Stay.  Do not shave (including legs and underarms) for at least 48 hours prior to the first CHG shower.  You may shave your face.  Please follow these instructions  carefully:   1.  Shower with CHG Soap the night before surgery and the                                morning of Surgery.  2.  If you choose to wash your hair, wash your hair first as usual with your       normal shampoo.  3.  After you shampoo, rinse your hair and body thoroughly to remove the                      Shampoo.  4.  Use CHG as you would any other liquid soap.  You can apply chg directly       to the skin and wash gently with scrungie or a clean washcloth.  5.  Apply the CHG Soap to your body ONLY FROM THE NECK DOWN.        Do not use on open wounds or open sores.  Avoid contact with your eyes,       ears, mouth and genitals (private parts).  Wash genitals (private parts)       with your normal soap.  6.  Wash thoroughly, paying special attention to the area where your surgery        will be performed.  7.  Thoroughly rinse  your body with warm water from the neck down.  8.  DO NOT shower/wash with your normal soap after using and rinsing off       the CHG Soap.  9.  Pat yourself dry with a clean towel.            10.  Wear clean pajamas.            11.  Place clean sheets on your bed the night of your first shower and do not        sleep with pets.  Day of Surgery  Do not apply any lotions/deoderants the morning of surgery.  Please wear clean clothes to the hospital/surgery center.    Please read over the following fact sheets that you were given. Pain Booklet, Coughing and Deep Breathing and Surgical Site Infection Prevention

## 2016-05-28 NOTE — Progress Notes (Signed)
Cardiologist denies   Medical Md is Dr.Schoenhoff  Echo/stress test/heart cath denies  EKG in epic from 05-02-16  CXR denies in past yr

## 2016-06-02 ENCOUNTER — Ambulatory Visit
Admission: RE | Admit: 2016-06-02 | Discharge: 2016-06-02 | Disposition: A | Discharge status: Home / Self Care | Payer: PPO | Source: Ambulatory Visit | Attending: General Surgery | Admitting: General Surgery

## 2016-06-02 DIAGNOSIS — R928 Other abnormal and inconclusive findings on diagnostic imaging of breast: Secondary | ICD-10-CM

## 2016-06-02 DIAGNOSIS — N6489 Other specified disorders of breast: Secondary | ICD-10-CM | POA: Diagnosis not present

## 2016-06-02 NOTE — H&P (Signed)
Stacy King Location: [facility] Patient #: [id number] DOB: [birth year] Divorced / Language: Stacy King / Race: White Female        History of Present Illness .  The patient is a 71 year old female who presents with a breast mass. This is a 71 year old Caucasian female, referred by Dr. Dorise Bullion at the [facility] for evaluation and management of abnormal mammogram and biopsy showing CSL of the right breast. [name] is her PCP.      She has no breast symptoms. Recent screening mammogram showed an area of distortion in the lateral right breast. Image guided biopsy shows complex sclerosing lesion, usual ductal hyperplasia, PAS H. She had a syncopal episode after the biopsy and went to the emergency room and was thought to have a vasovagal reaction. She's been fine since.    Past history consistent with kidney stones, C-section, benign axillary mass excision. Basically healthy Family history reveals second cousin had breast cancer. No ovarian cancer Social history reveals she is divorced with 1 child. Retired. Denies tobacco. Drinks alcohol occasionally     We had a long talk. I explained the statistics to her. Literature shows 5-9% chance of upgrading to atypia or early breast cancer. Most likely this is benign. She wants to have this area excised. That is reasonable. She'll be scheduled for right breast lumpectomy with radioactive seed localization. I discussed the indications, details, techniques, and numerous risk of the surgery with her. She is aware of the risk of bleeding, infection, reoperation for cancer, cosmetic deformity, skin necrosis, nerve damage with chronic pain or numbness. She understands these issues well. All her questions were answered. She agrees with this plan.   Past Surgical History  Breast Biopsy  Right. Cesarean Section - 1  Colon Polyp Removal - Colonoscopy   Diagnostic Studies History  Colonoscopy   1-5 years ago Mammogram  within last year Pap Smear  1-5 years ago  Allergies  No Known Drug Allergies   Medication History  Calcium-Vitamin D (600MG  Tablet Chewable, Oral) Active. Biotin (2500MCG Capsule, Oral) Active. Vitamin D3 (2000UNIT Capsule, Oral) Active. Magnesium (500MG  Tablet, Oral) Active. Krill Oil Omega-3 (500MG  Capsule, Oral) Active. Medications Reconciled  Social History  Alcohol use  Occasional alcohol use. Caffeine use  Tea. No drug use  Tobacco use  Never smoker.  Family History  Colon Polyps  Father. Diabetes Mellitus  Father, Mother, Sister. Heart Disease  Father. Hypertension  Father. Migraine Headache  Mother.  Pregnancy / Birth History  Age at menarche  56 years. Age of menopause  51-55 Contraceptive History  Intrauterine device, Oral contraceptives. Gravida  1 Length (months) of breastfeeding  7-12 Maternal age  90-30 Para  1  Other Problems  Gastroesophageal Reflux Disease  Kidney Stone     Review of Systems  General Not Present- Appetite Loss, Chills, Fatigue, Fever, Night Sweats, Weight Gain and Weight Loss. Skin Not Present- Change in Wart/Mole, Dryness, Hives, Jaundice, New Lesions, Non-Healing Wounds, Rash and Ulcer. HEENT Not Present- Earache, Hearing Loss, Hoarseness, Nose Bleed, Oral Ulcers, Ringing in the Ears, Seasonal Allergies, Sinus Pain, Sore Throat, Visual Disturbances, Wears glasses/contact lenses and Yellow Eyes. Respiratory Not Present- Bloody sputum, Chronic Cough, Difficulty Breathing, Snoring and Wheezing. Breast Not Present- Breast Mass, Breast Pain, Nipple Discharge and Skin Changes. Female Genitourinary Not Present- Frequency, Nocturia, Painful Urination, Pelvic Pain and Urgency. Musculoskeletal Not Present- Back Pain, Joint Pain, Joint Stiffness, Muscle Pain, Muscle Weakness and Swelling of Extremities. Neurological Not  Present- Decreased Memory, Fainting, Headaches, Numbness, Seizures,  Tingling, Tremor, Trouble walking and Weakness. Psychiatric Not Present- Anxiety, Bipolar, Change in Sleep Pattern, Depression, Fearful and Frequent crying. Endocrine Not Present- Cold Intolerance, Excessive Hunger, Hair Changes, Heat Intolerance, Hot flashes and New Diabetes. Hematology Not Present- Blood Thinners, Easy Bruising, Excessive bleeding, Gland problems, HIV and Persistent Infections.  Vitals  Weight: 173 lb Height: 62.5in Body Surface Area: 1.81 m Body Mass Index: 31.14 kg/m  Pulse: 70 (Regular)  BP: 120/80 (Sitting, Left Arm, Standard)    Physical Exam  General Mental Status-Alert. General Appearance-Consistent with stated age. Hydration-Well hydrated. Voice-Normal.  Head and Neck Head-normocephalic, atraumatic with no lesions or palpable masses. Trachea-midline. Thyroid Gland Characteristics - normal size and consistency.  Eye Eyeball - Bilateral-Extraocular movements intact. Sclera/Conjunctiva - Bilateral-No scleral icterus.  Chest and Lung Exam Chest and lung exam reveals -quiet, even and easy respiratory effort with no use of accessory muscles and on auscultation, normal breath sounds, no adventitious sounds and normal vocal resonance. Inspection Chest Wall - Normal. Back - normal.  Breast Note: Medium sized breast. Small area of ecchymosis superior right breast. No palpable mass in either breast. No other skin changes. No axillary adenopathy.   Cardiovascular Cardiovascular examination reveals -normal heart sounds, regular rate and rhythm with no murmurs and normal pedal pulses bilaterally.  Abdomen Inspection Inspection of the abdomen reveals - No Hernias. Skin - Scar - no surgical scars. Palpation/Percussion Palpation and Percussion of the abdomen reveal - Soft, Non Tender, No Rebound tenderness, No Rigidity (guarding) and No hepatosplenomegaly. Auscultation Auscultation of the abdomen reveals - Bowel sounds  normal.  Neurologic Neurologic evaluation reveals -alert and oriented x 3 with no impairment of recent or remote memory. Mental Status-Normal.  Musculoskeletal Normal Exam - Left-Upper Extremity Strength Normal and Lower Extremity Strength Normal. Normal Exam - Right-Upper Extremity Strength Normal and Lower Extremity Strength Normal.  Lymphatic Head & Neck  General Head & Neck Lymphatics: Bilateral - Description - Normal. Axillary  General Axillary Region: Bilateral - Description - Normal. Tenderness - Non Tender. Femoral & Inguinal  Generalized Femoral & Inguinal Lymphatics: Bilateral - Description - Normal. Tenderness - Non Tender.    Assessment & Plan  ABNORMAL MAMMOGRAM OF RIGHT BREAST (R92.8)  Your recent imaging studies and biopsy show a complex sclerosing lesion in the lateral right breast This is probably not cancer Our literature shows a 5-9% risk of upgrading this to either atypia or early breast cancer on excisional biopsy Although this risk is low we do recommend excising these areas  You have stated that she preferred to proceed with the excisional biopsy I agree, annual be scheduled for right breast lumpectomy with radioactive seed localization We have discussed the indications, techniques, and risks of this surgery in detail    [facility]. Dalbert Batman, M.D., [facility] General and Minimally invasive Surgery Breast and Colorectal Surgery Office:   [phone] Pager:   [phone]

## 2016-06-03 ENCOUNTER — Ambulatory Visit (HOSPITAL_COMMUNITY): Payer: PPO | Admitting: Certified Registered Nurse Anesthetist

## 2016-06-03 ENCOUNTER — Ambulatory Visit (HOSPITAL_COMMUNITY)
Admission: RE | Admit: 2016-06-03 | Discharge: 2016-06-03 | Disposition: A | Discharge status: Home / Self Care | Payer: PPO | Source: Ambulatory Visit | Attending: General Surgery | Admitting: General Surgery

## 2016-06-03 ENCOUNTER — Encounter (HOSPITAL_COMMUNITY)
Admission: RE | Disposition: A | Discharge status: Home / Self Care | Payer: Self-pay | Source: Ambulatory Visit | Attending: General Surgery

## 2016-06-03 ENCOUNTER — Ambulatory Visit
Admission: RE | Admit: 2016-06-03 | Discharge: 2016-06-03 | Disposition: A | Discharge status: Home / Self Care | Payer: PPO | Source: Ambulatory Visit | Attending: General Surgery | Admitting: General Surgery

## 2016-06-03 ENCOUNTER — Encounter (HOSPITAL_COMMUNITY): Payer: Self-pay | Admitting: *Deleted

## 2016-06-03 DIAGNOSIS — R928 Other abnormal and inconclusive findings on diagnostic imaging of breast: Secondary | ICD-10-CM

## 2016-06-03 DIAGNOSIS — N6091 Unspecified benign mammary dysplasia of right breast: Secondary | ICD-10-CM | POA: Insufficient documentation

## 2016-06-03 DIAGNOSIS — D241 Benign neoplasm of right breast: Secondary | ICD-10-CM | POA: Insufficient documentation

## 2016-06-03 DIAGNOSIS — Z803 Family history of malignant neoplasm of breast: Secondary | ICD-10-CM | POA: Insufficient documentation

## 2016-06-03 DIAGNOSIS — N6081 Other benign mammary dysplasias of right breast: Secondary | ICD-10-CM | POA: Diagnosis not present

## 2016-06-03 DIAGNOSIS — K219 Gastro-esophageal reflux disease without esophagitis: Secondary | ICD-10-CM | POA: Diagnosis not present

## 2016-06-03 DIAGNOSIS — N6011 Diffuse cystic mastopathy of right breast: Secondary | ICD-10-CM | POA: Diagnosis not present

## 2016-06-03 HISTORY — PX: BREAST LUMPECTOMY WITH RADIOACTIVE SEED LOCALIZATION: SHX6424

## 2016-06-03 HISTORY — DX: Other abnormal and inconclusive findings on diagnostic imaging of breast: R92.8

## 2016-06-03 SURGERY — BREAST LUMPECTOMY WITH RADIOACTIVE SEED LOCALIZATION
Anesthesia: General | Site: Breast | Laterality: Right

## 2016-06-03 MED ORDER — PROPOFOL 10 MG/ML IV BOLUS
INTRAVENOUS | Status: AC
  Filled 2016-06-03: qty 20

## 2016-06-03 MED ORDER — MIDAZOLAM HCL 2 MG/2ML IJ SOLN
INTRAMUSCULAR | Status: AC
  Filled 2016-06-03: qty 2

## 2016-06-03 MED ORDER — DEXAMETHASONE SODIUM PHOSPHATE 10 MG/ML IJ SOLN
INTRAMUSCULAR | Status: DC | PRN
  Administered 2016-06-03: 10 mg via INTRAVENOUS

## 2016-06-03 MED ORDER — SODIUM CHLORIDE 0.9 % IV SOLN: 250.0000 mL | INTRAVENOUS | Status: DC | PRN

## 2016-06-03 MED ORDER — PHENYLEPHRINE 40 MCG/ML (10ML) SYRINGE FOR IV PUSH (FOR BLOOD PRESSURE SUPPORT)
PREFILLED_SYRINGE | INTRAVENOUS | Status: AC
  Filled 2016-06-03: qty 10

## 2016-06-03 MED ORDER — BUPIVACAINE HCL (PF) 0.25 % IJ SOLN
INTRAMUSCULAR | Status: DC | PRN
  Administered 2016-06-03: 10 mL

## 2016-06-03 MED ORDER — CELECOXIB 200 MG PO CAPS
ORAL_CAPSULE | ORAL | Status: AC
  Administered 2016-06-03: 400 mg via ORAL
  Filled 2016-06-03: qty 2

## 2016-06-03 MED ORDER — GABAPENTIN 300 MG PO CAPS
ORAL_CAPSULE | ORAL | Status: AC
  Administered 2016-06-03: 300 mg via ORAL
  Filled 2016-06-03: qty 1

## 2016-06-03 MED ORDER — FENTANYL CITRATE (PF) 100 MCG/2ML IJ SOLN: 25.0000 ug | INTRAMUSCULAR | Status: DC | PRN

## 2016-06-03 MED ORDER — 0.9 % SODIUM CHLORIDE (POUR BTL) OPTIME
TOPICAL | Status: DC | PRN
  Administered 2016-06-03: 1000 mL

## 2016-06-03 MED ORDER — DEXAMETHASONE SODIUM PHOSPHATE 10 MG/ML IJ SOLN
INTRAMUSCULAR | Status: AC
  Filled 2016-06-03: qty 1

## 2016-06-03 MED ORDER — FENTANYL CITRATE (PF) 100 MCG/2ML IJ SOLN
INTRAMUSCULAR | Status: DC | PRN
  Administered 2016-06-03 (×2): 25 ug via INTRAVENOUS
  Administered 2016-06-03: 50 ug via INTRAVENOUS

## 2016-06-03 MED ORDER — METOCLOPRAMIDE HCL 5 MG/ML IJ SOLN: 10.0000 mg | Freq: Once | INTRAMUSCULAR | Status: DC | PRN

## 2016-06-03 MED ORDER — ACETAMINOPHEN 500 MG PO TABS
1000.0000 mg | ORAL_TABLET | ORAL | Status: AC
  Administered 2016-06-03: 1000 mg via ORAL

## 2016-06-03 MED ORDER — HYDROCODONE-ACETAMINOPHEN 5-325 MG PO TABS
ORAL_TABLET | ORAL | Status: AC
  Administered 2016-06-03: 1
  Filled 2016-06-03: qty 1

## 2016-06-03 MED ORDER — ACETAMINOPHEN 650 MG RE SUPP
650.0000 mg | RECTAL | Status: DC | PRN
  Filled 2016-06-03: qty 1

## 2016-06-03 MED ORDER — SODIUM CHLORIDE 0.9% FLUSH: 3.0000 mL | Freq: Two times a day (BID) | INTRAVENOUS | Status: DC

## 2016-06-03 MED ORDER — LACTATED RINGERS IV SOLN: INTRAVENOUS | Status: DC

## 2016-06-03 MED ORDER — CELECOXIB 200 MG PO CAPS
400.0000 mg | ORAL_CAPSULE | ORAL | Status: AC
  Administered 2016-06-03: 400 mg via ORAL

## 2016-06-03 MED ORDER — BUPIVACAINE HCL (PF) 0.25 % IJ SOLN
INTRAMUSCULAR | Status: AC
  Filled 2016-06-03: qty 30

## 2016-06-03 MED ORDER — ONDANSETRON HCL 4 MG/2ML IJ SOLN
INTRAMUSCULAR | Status: AC
  Filled 2016-06-03: qty 2

## 2016-06-03 MED ORDER — GABAPENTIN 300 MG PO CAPS
300.0000 mg | ORAL_CAPSULE | ORAL | Status: AC
  Administered 2016-06-03: 300 mg via ORAL

## 2016-06-03 MED ORDER — SODIUM CHLORIDE 0.9% FLUSH: 3.0000 mL | INTRAVENOUS | Status: DC | PRN

## 2016-06-03 MED ORDER — CEFAZOLIN SODIUM-DEXTROSE 2-4 GM/100ML-% IV SOLN
INTRAVENOUS | Status: AC
Start: 2016-06-03 — End: 2016-06-03
  Filled 2016-06-03: qty 100

## 2016-06-03 MED ORDER — EPHEDRINE SULFATE 50 MG/ML IJ SOLN
INTRAMUSCULAR | Status: DC | PRN
  Administered 2016-06-03: 5 mg via INTRAVENOUS

## 2016-06-03 MED ORDER — OXYCODONE HCL 5 MG PO TABS: 5.0000 mg | ORAL_TABLET | ORAL | Status: DC | PRN

## 2016-06-03 MED ORDER — ACETAMINOPHEN 500 MG PO TABS
ORAL_TABLET | ORAL | Status: AC
Start: 2016-06-03 — End: 2016-06-03
  Administered 2016-06-03: 1000 mg via ORAL
  Filled 2016-06-03: qty 2

## 2016-06-03 MED ORDER — PROPOFOL 10 MG/ML IV BOLUS
INTRAVENOUS | Status: DC | PRN
  Administered 2016-06-03: 150 mg via INTRAVENOUS

## 2016-06-03 MED ORDER — LACTATED RINGERS IV SOLN
INTRAVENOUS | Status: DC | PRN
  Administered 2016-06-03: 11:00:00 via INTRAVENOUS

## 2016-06-03 MED ORDER — LIDOCAINE HCL (CARDIAC) 20 MG/ML IV SOLN
INTRAVENOUS | Status: DC | PRN
  Administered 2016-06-03: 100 mg via INTRAVENOUS

## 2016-06-03 MED ORDER — MIDAZOLAM HCL 2 MG/2ML IJ SOLN
INTRAMUSCULAR | Status: DC | PRN
  Administered 2016-06-03: 2 mg via INTRAVENOUS

## 2016-06-03 MED ORDER — FENTANYL CITRATE (PF) 100 MCG/2ML IJ SOLN
INTRAMUSCULAR | Status: AC
  Filled 2016-06-03: qty 2

## 2016-06-03 MED ORDER — MEPERIDINE HCL 25 MG/ML IJ SOLN: 6.2500 mg | INTRAMUSCULAR | Status: DC | PRN

## 2016-06-03 MED ORDER — ONDANSETRON HCL 4 MG/2ML IJ SOLN
INTRAMUSCULAR | Status: DC | PRN
  Administered 2016-06-03: 4 mg via INTRAVENOUS

## 2016-06-03 MED ORDER — CEFAZOLIN SODIUM-DEXTROSE 2-4 GM/100ML-% IV SOLN
2.0000 g | INTRAVENOUS | Status: AC
  Administered 2016-06-03: 2 g via INTRAVENOUS

## 2016-06-03 MED ORDER — ACETAMINOPHEN 325 MG PO TABS
650.0000 mg | ORAL_TABLET | ORAL | Status: DC | PRN
  Filled 2016-06-03: qty 2

## 2016-06-03 MED ORDER — HYDROCODONE-ACETAMINOPHEN 5-325 MG PO TABS: 1.0000 | ORAL_TABLET | Freq: Four times a day (QID) | ORAL | 0 refills | Status: DC | PRN

## 2016-06-03 SURGICAL SUPPLY — 46 items
ADH SKN CLS APL DERMABOND .7 (GAUZE/BANDAGES/DRESSINGS) ×1
APPLIER CLIP 9.375 MED OPEN (MISCELLANEOUS)
APR CLP MED 9.3 20 MLT OPN (MISCELLANEOUS)
BINDER BREAST LRG (GAUZE/BANDAGES/DRESSINGS) ×2 IMPLANT
BINDER BREAST XLRG (GAUZE/BANDAGES/DRESSINGS) IMPLANT
BLADE SURG 15 STRL LF DISP TIS (BLADE) ×1 IMPLANT
BLADE SURG 15 STRL SS (BLADE) ×3
CANISTER SUCTION 2500CC (MISCELLANEOUS) ×3 IMPLANT
CHLORAPREP W/TINT 26ML (MISCELLANEOUS) ×3 IMPLANT
CLIP APPLIE 9.375 MED OPEN (MISCELLANEOUS) IMPLANT
COVER PROBE W GEL 5X96 (DRAPES) ×3 IMPLANT
COVER SURGICAL LIGHT HANDLE (MISCELLANEOUS) ×3 IMPLANT
DERMABOND ADVANCED (GAUZE/BANDAGES/DRESSINGS) ×2
DERMABOND ADVANCED .7 DNX12 (GAUZE/BANDAGES/DRESSINGS) ×1 IMPLANT
DEVICE DUBIN SPECIMEN MAMMOGRA (MISCELLANEOUS) ×3 IMPLANT
DRAPE CHEST BREAST 15X10 FENES (DRAPES) ×3 IMPLANT
DRAPE UTILITY XL STRL (DRAPES) ×3 IMPLANT
DRSG PAD ABDOMINAL 8X10 ST (GAUZE/BANDAGES/DRESSINGS) ×3 IMPLANT
ELECT CAUTERY BLADE 6.4 (BLADE) ×3 IMPLANT
ELECT REM PT RETURN 9FT ADLT (ELECTROSURGICAL) ×3
ELECTRODE REM PT RTRN 9FT ADLT (ELECTROSURGICAL) ×1 IMPLANT
GLOVE EUDERMIC 7 POWDERFREE (GLOVE) ×3 IMPLANT
GOWN STRL REUS W/ TWL LRG LVL3 (GOWN DISPOSABLE) ×1 IMPLANT
GOWN STRL REUS W/ TWL XL LVL3 (GOWN DISPOSABLE) ×1 IMPLANT
GOWN STRL REUS W/TWL LRG LVL3 (GOWN DISPOSABLE) ×3
GOWN STRL REUS W/TWL XL LVL3 (GOWN DISPOSABLE) ×3
ILLUMINATOR WAVEGUIDE N/F (MISCELLANEOUS) IMPLANT
KIT BASIN OR (CUSTOM PROCEDURE TRAY) ×3 IMPLANT
KIT MARKER MARGIN INK (KITS) ×3 IMPLANT
LIGHT WAVEGUIDE WIDE FLAT (MISCELLANEOUS) IMPLANT
NDL HYPO 25GX1X1/2 BEV (NEEDLE) IMPLANT
NEEDLE HYPO 25GX1X1/2 BEV (NEEDLE) IMPLANT
NS IRRIG 1000ML POUR BTL (IV SOLUTION) IMPLANT
PACK SURGICAL SETUP 50X90 (CUSTOM PROCEDURE TRAY) ×3 IMPLANT
PENCIL BUTTON HOLSTER BLD 10FT (ELECTRODE) ×3 IMPLANT
SPONGE LAP 4X18 X RAY DECT (DISPOSABLE) ×3 IMPLANT
SUT MNCRL AB 4-0 PS2 18 (SUTURE) ×3 IMPLANT
SUT SILK 2 0 SH (SUTURE) ×3 IMPLANT
SUT VIC AB 3-0 SH 18 (SUTURE) ×3 IMPLANT
SYR BULB 3OZ (MISCELLANEOUS) ×3 IMPLANT
SYR CONTROL 10ML LL (SYRINGE) ×3 IMPLANT
TOWEL OR 17X24 6PK STRL BLUE (TOWEL DISPOSABLE) ×3 IMPLANT
TOWEL OR 17X26 10 PK STRL BLUE (TOWEL DISPOSABLE) ×3 IMPLANT
TUBE CONNECTING 12'X1/4 (SUCTIONS) ×1
TUBE CONNECTING 12X1/4 (SUCTIONS) ×2 IMPLANT
YANKAUER SUCT BULB TIP NO VENT (SUCTIONS) ×3 IMPLANT

## 2016-06-03 NOTE — Anesthesia Procedure Notes (Signed)
Procedure Name: LMA Insertion Date/Time: 06/03/2016 11:47 AM Performed by: Rejeana Brock L Pre-anesthesia Checklist: Patient identified, Emergency Drugs available, Suction available and Patient being monitored Patient Re-evaluated:Patient Re-evaluated prior to inductionOxygen Delivery Method: Circle System Utilized Preoxygenation: Pre-oxygenation with 100% oxygen Intubation Type: IV induction Ventilation: Mask ventilation without difficulty LMA: LMA inserted LMA Size: 4.0 Number of attempts: 1 Airway Equipment and Method: Bite block Placement Confirmation: positive ETCO2 and breath sounds checked- equal and bilateral Tube secured with: Tape Dental Injury: Teeth and Oropharynx as per pre-operative assessment

## 2016-06-03 NOTE — Anesthesia Postprocedure Evaluation (Signed)
Anesthesia Post Note  Patient: Stacy King  Procedure(s) Performed: Procedure(s) (LRB): RIGHT BREAST LUMPECTOMY WITH RADIOACTIVE SEED LOCALIZATION (Right)  Patient location during evaluation: PACU Anesthesia Type: General Level of consciousness: awake and alert Pain management: pain level controlled Vital Signs Assessment: post-procedure vital signs reviewed and stable Respiratory status: spontaneous breathing, nonlabored ventilation, respiratory function stable and patient connected to nasal cannula oxygen Cardiovascular status: blood pressure returned to baseline and stable Postop Assessment: no signs of nausea or vomiting Anesthetic complications: no       Last Vitals:  Vitals:   06/03/16 1250 06/03/16 1303  BP: 125/68 (!) 159/89  Pulse: 85 81  Resp: 19 18  Temp: 36.4 C     Last Pain:  Vitals:   06/03/16 1303  TempSrc:   PainSc: 0-No pain                 Montez Hageman

## 2016-06-03 NOTE — Discharge Instructions (Signed)
[facility] °Office Phone Number [phone] ° °BREAST BIOPSY/ PARTIAL MASTECTOMY: POST OP INSTRUCTIONS ° °Always review your discharge instruction sheet given to you by the facility where your surgery was performed. ° °IF YOU HAVE DISABILITY OR FAMILY LEAVE FORMS, YOU MUST BRING THEM TO THE OFFICE FOR PROCESSING.  DO NOT GIVE THEM TO YOUR DOCTOR. ° °1. A prescription for pain medication may be given to you upon discharge.  Take your pain medication as prescribed, if needed.  If narcotic pain medicine is not needed, then you may take acetaminophen (Tylenol) or ibuprofen (Advil) as needed. °2. Take your usually prescribed medications unless otherwise directed °3. If you need a refill on your pain medication, please contact your pharmacy.  They will contact our office to request authorization.  Prescriptions will not be filled after 5pm or on week-ends. °4. You should eat very light the first 24 hours after surgery, such as soup, crackers, pudding, etc.  Resume your normal diet the day after surgery. °5. Most patients will experience some swelling and bruising in the breast.  Ice packs and a good support bra will help.  Swelling and bruising can take several days to resolve.  °6. It is common to experience some constipation if taking pain medication after surgery.  Increasing fluid intake and taking a stool softener will usually help or prevent this problem from occurring.  A mild laxative (Milk of Magnesia or Miralax) should be taken according to package directions if there are no bowel movements after 48 hours. °7. Unless discharge instructions indicate otherwise, you may remove your bandages 24-48 hours after surgery, and you may shower at that time.  You may have steri-strips (small skin tapes) in place directly over the incision.  These strips should be left on the skin for 7-10 days.  If your surgeon used skin glue on the incision, you may shower in 24 hours.  The glue will flake off over the  next 2-3 weeks.  Any sutures or staples will be removed at the office during your follow-up visit. °8. ACTIVITIES:  You may resume regular daily activities (gradually increasing) beginning the next day.  Wearing a good support bra or sports bra minimizes pain and swelling.  You may have sexual intercourse when it is comfortable. °a. You may drive when you no longer are taking prescription pain medication, you can comfortably wear a seatbelt, and you can safely maneuver your car and apply brakes. °b. RETURN TO WORK:  ______________________________________________________________________________________ °9. You should see your doctor in the office for a follow-up appointment approximately two weeks after your surgery.  Your doctor’s nurse will typically make your follow-up appointment when she calls you with your pathology report.  Expect your pathology report 2-3 business days after your surgery.  You may call to check if you do not hear from us after three days. °10. OTHER INSTRUCTIONS: _______________________________________________________________________________________________ _____________________________________________________________________________________________________________________________________ °_____________________________________________________________________________________________________________________________________ °_____________________________________________________________________________________________________________________________________ ° °WHEN TO CALL YOUR DOCTOR: °1. Fever over 101.0 °2. Nausea and/or vomiting. °3. Extreme swelling or bruising. °4. Continued bleeding from incision. °5. Increased pain, redness, or drainage from the incision. ° °The clinic staff is available to answer your questions during regular business hours.  Please don’t hesitate to call and ask to speak to one of the nurses for clinical concerns.  If you have a medical emergency, go to the nearest  emergency room or call 911.  A surgeon from [facility] is always on call at the hospital. ° °For further questions, please visit [email or web address]  °

## 2016-06-03 NOTE — Anesthesia Preprocedure Evaluation (Signed)
Anesthesia Evaluation  Patient identified by MRN, date of birth, ID band Patient awake    Reviewed: Allergy & Precautions, NPO status , Patient's Chart, lab work & pertinent test results  Airway Mallampati: II  TM Distance: >3 FB Neck ROM: Full    Dental no notable dental hx.    Pulmonary neg pulmonary ROS,    Pulmonary exam normal breath sounds clear to auscultation       Cardiovascular negative cardio ROS Normal cardiovascular exam Rhythm:Regular Rate:Normal     Neuro/Psych negative neurological ROS  negative psych ROS   GI/Hepatic Neg liver ROS, GERD  Controlled,  Endo/Other  negative endocrine ROS  Renal/GU negative Renal ROS  negative genitourinary   Musculoskeletal negative musculoskeletal ROS (+)   Abdominal   Peds negative pediatric ROS (+)  Hematology negative hematology ROS (+)   Anesthesia Other Findings   Reproductive/Obstetrics negative OB ROS                             Anesthesia Physical Anesthesia Plan  ASA: II  Anesthesia Plan: General   Post-op Pain Management:    Induction: Intravenous  Airway Management Planned: LMA  Additional Equipment:   Intra-op Plan: Utilization Of Total Body Hypothermia per surgeon request  Post-operative Plan: Extubation in OR  Informed Consent: I have reviewed the patients History and Physical, chart, labs and discussed the procedure including the risks, benefits and alternatives for the proposed anesthesia with the patient or authorized representative who has indicated his/her understanding and acceptance.   Dental advisory given  Plan Discussed with: CRNA  Anesthesia Plan Comments:         Anesthesia Quick Evaluation

## 2016-06-03 NOTE — Op Note (Signed)
  Patient Name:           Stacy King   Date of Surgery:        06/03/2016  Pre op Diagnosis:      Complex sclerosing lesion right breast  Post op Diagnosis:    Same  Procedure:                 Right breast lumpectomy with radioactive seed localization  Surgeon:                     Edsel Petrin. Dalbert Batman, M.D., FACS  Assistant:                      OR staff   Indication for Assistant: n/a  Operative Indications:   . This is a 71 year old Caucasian female, referred by Dr. Dorise Bullion at the [facility] for evaluation and management of abnormal mammogram and biopsy showing CSL of the right breast. [name] is her PCP.      She has no breast symptoms. Recent screening mammogram showed an area of distortion in the lateral right breast. Image guided biopsy shows complex sclerosing lesion, usual ductal hyperplasia, PASH. . Family history reveals second cousin had breast cancer. No ovarian cancer     We had a long talk. I explained the statistics to her. Literature shows 5-9% chance of upgrading to atypia or early breast cancer. Most likely this is benign. She wants to have this area excised. That is reasonable. She'll be scheduled for right breast lumpectomy with radioactive seed localization.  She agrees with this plan.  Operative Findings:       The radioactive seed and the original biopsy marker clip were very close to each other in the lateral right breast at the 9:00 position.  The specimen mammogram looked good showing the marker clip and the radioactive seed in the center of the specimen.  There was no palpable abnormality.  Procedure in Detail:          Following the induction of general LMA anesthesia the patient's right breast was prepped and draped in a sterile fashion.  Surgical timeout was performed.  Intravenous antibiotics were given.  0.5% Marcaine was used as local infiltration anesthetic.      Using the neoprobe as a guide I selected a  curvilinear skin crease incision in the far lateral right breast at the 9:00 position.  Dissection was carried down into the breast tissue using electrocautery and the lumpectomy was performed using the neoprobe as a guide frequently.  The specimen was removed and marked with silk sutures and a 6 color ink kit to orient the pathologist.  The specimen mammogram looked good as described above.  The specimen was marked and sent to the lab where the seed was retrieved.  The wound was irrigated with saline.  Hemostasis was excellent.  The lumpectomy cavity was marked with 5 metal clips according to our protocol.  The breast tissues were closed with interrupted 3-0 Vicryl sutures and the skin closed with a running subcuticular 4-0 Monocryl and Dermabond.  The patient tolerated the procedure well was taken to PACU in stable condition.  EBL 10 mL.  Counts correct.  Complications none.   Edsel Petrin. Dalbert Batman, M.D., FACS General and Minimally Invasive Surgery Breast and Colorectal Surgery  06/03/2016 12:26 PM

## 2016-06-03 NOTE — Interval H&P Note (Signed)
History and Physical Interval Note:  06/03/2016 11:08 AM  Stacy King  has presented today for surgery, with the diagnosis of ABNORMAL MAMMOGRAM RIGHT BREAST   The various methods of treatment have been discussed with the patient and family. After consideration of risks, benefits and other options for treatment, the patient has consented to  Procedure(s): RIGHT BREAST LUMPECTOMY WITH RADIOACTIVE SEED LOCALIZATION (Right) as a surgical intervention .  The patient's history has been reviewed, patient examined, no change in status, stable for surgery.  I have reviewed the patient's chart and labs.  Questions were answered to the patient's satisfaction.     Adin Hector

## 2016-06-03 NOTE — Transfer of Care (Signed)
Immediate Anesthesia Transfer of Care Note  Patient: Stacy King  Procedure(s) Performed: Procedure(s): RIGHT BREAST LUMPECTOMY WITH RADIOACTIVE SEED LOCALIZATION (Right)  Patient Location: PACU  Anesthesia Type:General  Level of Consciousness: awake, alert  and oriented  Airway & Oxygen Therapy: Patient Spontanous Breathing and Patient connected to nasal cannula oxygen  Post-op Assessment: Report given to RN, Post -op Vital signs reviewed and stable and Patient moving all extremities X 4  Post vital signs: Reviewed and stable  Last Vitals:  Vitals:   06/03/16 0918 06/03/16 1232  BP: (!) 155/67 (!) 141/75  Pulse: 75 84  Resp: 20 (!) 8  Temp: 36.5 C 36.1 C    Last Pain:  Vitals:   06/03/16 0918  TempSrc: Oral         Complications: No apparent anesthesia complications

## 2016-06-04 ENCOUNTER — Encounter (HOSPITAL_COMMUNITY): Payer: Self-pay | Admitting: General Surgery

## 2016-06-05 NOTE — Progress Notes (Signed)
Inform patient of Pathology report,. Pathology is completely benign.  Complex sclerosing lesion.  No further surgery or treatment necessary.  I will discuss in detail at next office visit.  hmi

## 2016-06-17 DIAGNOSIS — H0014 Chalazion left upper eyelid: Secondary | ICD-10-CM | POA: Diagnosis not present

## 2016-08-20 DIAGNOSIS — R05 Cough: Secondary | ICD-10-CM | POA: Diagnosis not present

## 2016-08-20 DIAGNOSIS — E559 Vitamin D deficiency, unspecified: Secondary | ICD-10-CM | POA: Diagnosis not present

## 2016-08-20 DIAGNOSIS — J301 Allergic rhinitis due to pollen: Secondary | ICD-10-CM | POA: Diagnosis not present

## 2016-08-20 DIAGNOSIS — K219 Gastro-esophageal reflux disease without esophagitis: Secondary | ICD-10-CM | POA: Diagnosis not present

## 2016-09-09 ENCOUNTER — Other Ambulatory Visit: Payer: Self-pay | Admitting: Internal Medicine

## 2016-09-09 ENCOUNTER — Ambulatory Visit
Admission: RE | Admit: 2016-09-09 | Discharge: 2016-09-09 | Disposition: A | Discharge status: Home / Self Care | Payer: PPO | Source: Ambulatory Visit | Attending: Internal Medicine | Admitting: Internal Medicine

## 2016-09-09 DIAGNOSIS — R059 Cough, unspecified: Secondary | ICD-10-CM

## 2016-09-09 DIAGNOSIS — E7800 Pure hypercholesterolemia, unspecified: Secondary | ICD-10-CM | POA: Diagnosis not present

## 2016-09-09 DIAGNOSIS — R05 Cough: Secondary | ICD-10-CM

## 2016-09-12 DIAGNOSIS — Z01419 Encounter for gynecological examination (general) (routine) without abnormal findings: Secondary | ICD-10-CM | POA: Diagnosis not present

## 2016-09-19 DIAGNOSIS — E7800 Pure hypercholesterolemia, unspecified: Secondary | ICD-10-CM | POA: Diagnosis not present

## 2016-10-14 DIAGNOSIS — H5203 Hypermetropia, bilateral: Secondary | ICD-10-CM | POA: Diagnosis not present

## 2016-10-14 DIAGNOSIS — H52223 Regular astigmatism, bilateral: Secondary | ICD-10-CM | POA: Diagnosis not present

## 2016-10-14 DIAGNOSIS — H524 Presbyopia: Secondary | ICD-10-CM | POA: Diagnosis not present

## 2016-10-14 DIAGNOSIS — H40013 Open angle with borderline findings, low risk, bilateral: Secondary | ICD-10-CM | POA: Diagnosis not present

## 2016-10-14 DIAGNOSIS — H25813 Combined forms of age-related cataract, bilateral: Secondary | ICD-10-CM | POA: Diagnosis not present

## 2016-10-14 DIAGNOSIS — H0014 Chalazion left upper eyelid: Secondary | ICD-10-CM | POA: Diagnosis not present

## 2016-10-14 DIAGNOSIS — H1851 Endothelial corneal dystrophy: Secondary | ICD-10-CM | POA: Diagnosis not present

## 2017-02-05 DIAGNOSIS — Z23 Encounter for immunization: Secondary | ICD-10-CM | POA: Diagnosis not present

## 2017-03-10 DIAGNOSIS — L814 Other melanin hyperpigmentation: Secondary | ICD-10-CM | POA: Diagnosis not present

## 2017-03-10 DIAGNOSIS — L821 Other seborrheic keratosis: Secondary | ICD-10-CM | POA: Diagnosis not present

## 2017-03-10 DIAGNOSIS — D1801 Hemangioma of skin and subcutaneous tissue: Secondary | ICD-10-CM | POA: Diagnosis not present

## 2017-03-10 DIAGNOSIS — D225 Melanocytic nevi of trunk: Secondary | ICD-10-CM | POA: Diagnosis not present

## 2017-03-10 DIAGNOSIS — D485 Neoplasm of uncertain behavior of skin: Secondary | ICD-10-CM | POA: Diagnosis not present

## 2017-03-18 DIAGNOSIS — Z6831 Body mass index (BMI) 31.0-31.9, adult: Secondary | ICD-10-CM | POA: Diagnosis not present

## 2017-03-18 DIAGNOSIS — E559 Vitamin D deficiency, unspecified: Secondary | ICD-10-CM | POA: Diagnosis not present

## 2017-03-18 DIAGNOSIS — E7800 Pure hypercholesterolemia, unspecified: Secondary | ICD-10-CM | POA: Diagnosis not present

## 2017-03-18 DIAGNOSIS — D126 Benign neoplasm of colon, unspecified: Secondary | ICD-10-CM | POA: Diagnosis not present

## 2017-03-18 DIAGNOSIS — Z0000 Encounter for general adult medical examination without abnormal findings: Secondary | ICD-10-CM | POA: Diagnosis not present

## 2017-03-18 DIAGNOSIS — Z1389 Encounter for screening for other disorder: Secondary | ICD-10-CM | POA: Diagnosis not present

## 2017-03-18 DIAGNOSIS — E669 Obesity, unspecified: Secondary | ICD-10-CM | POA: Diagnosis not present

## 2017-04-08 DIAGNOSIS — Z1211 Encounter for screening for malignant neoplasm of colon: Secondary | ICD-10-CM | POA: Diagnosis not present

## 2017-05-11 ENCOUNTER — Other Ambulatory Visit: Payer: Self-pay | Admitting: Internal Medicine

## 2017-05-11 DIAGNOSIS — Z1231 Encounter for screening mammogram for malignant neoplasm of breast: Secondary | ICD-10-CM

## 2017-05-18 DIAGNOSIS — E7800 Pure hypercholesterolemia, unspecified: Secondary | ICD-10-CM | POA: Diagnosis not present

## 2017-05-18 DIAGNOSIS — Z87442 Personal history of urinary calculi: Secondary | ICD-10-CM | POA: Diagnosis not present

## 2017-05-18 DIAGNOSIS — R3129 Other microscopic hematuria: Secondary | ICD-10-CM | POA: Diagnosis not present

## 2017-05-29 ENCOUNTER — Ambulatory Visit
Admission: RE | Admit: 2017-05-29 | Discharge: 2017-05-29 | Disposition: A | Discharge status: Home / Self Care | Payer: PPO | Source: Ambulatory Visit | Attending: Internal Medicine | Admitting: Internal Medicine

## 2017-05-29 DIAGNOSIS — Z1231 Encounter for screening mammogram for malignant neoplasm of breast: Secondary | ICD-10-CM

## 2017-06-02 DIAGNOSIS — E2839 Other primary ovarian failure: Secondary | ICD-10-CM | POA: Diagnosis not present

## 2017-06-12 DIAGNOSIS — Z87442 Personal history of urinary calculi: Secondary | ICD-10-CM | POA: Diagnosis not present

## 2017-06-12 DIAGNOSIS — R311 Benign essential microscopic hematuria: Secondary | ICD-10-CM | POA: Diagnosis not present

## 2017-06-24 DIAGNOSIS — K7689 Other specified diseases of liver: Secondary | ICD-10-CM | POA: Diagnosis not present

## 2017-06-24 DIAGNOSIS — R311 Benign essential microscopic hematuria: Secondary | ICD-10-CM | POA: Diagnosis not present

## 2017-06-24 DIAGNOSIS — N289 Disorder of kidney and ureter, unspecified: Secondary | ICD-10-CM | POA: Diagnosis not present

## 2017-07-24 DIAGNOSIS — R311 Benign essential microscopic hematuria: Secondary | ICD-10-CM | POA: Diagnosis not present

## 2017-09-24 DIAGNOSIS — J3089 Other allergic rhinitis: Secondary | ICD-10-CM | POA: Diagnosis not present

## 2017-09-24 DIAGNOSIS — R0982 Postnasal drip: Secondary | ICD-10-CM | POA: Diagnosis not present

## 2017-09-24 DIAGNOSIS — R05 Cough: Secondary | ICD-10-CM | POA: Diagnosis not present

## 2017-09-28 DIAGNOSIS — J0100 Acute maxillary sinusitis, unspecified: Secondary | ICD-10-CM | POA: Diagnosis not present

## 2017-09-28 DIAGNOSIS — R05 Cough: Secondary | ICD-10-CM | POA: Diagnosis not present

## 2017-10-13 ENCOUNTER — Other Ambulatory Visit: Payer: Self-pay | Admitting: Internal Medicine

## 2017-10-13 ENCOUNTER — Ambulatory Visit
Admission: RE | Admit: 2017-10-13 | Discharge: 2017-10-13 | Disposition: A | Discharge status: Home / Self Care | Payer: PPO | Source: Ambulatory Visit | Attending: Internal Medicine | Admitting: Internal Medicine

## 2017-10-13 DIAGNOSIS — R0989 Other specified symptoms and signs involving the circulatory and respiratory systems: Secondary | ICD-10-CM | POA: Diagnosis not present

## 2017-10-13 DIAGNOSIS — R059 Cough, unspecified: Secondary | ICD-10-CM

## 2017-10-13 DIAGNOSIS — R05 Cough: Secondary | ICD-10-CM | POA: Diagnosis not present

## 2017-10-30 DIAGNOSIS — H40011 Open angle with borderline findings, low risk, right eye: Secondary | ICD-10-CM | POA: Diagnosis not present

## 2017-10-30 DIAGNOSIS — H524 Presbyopia: Secondary | ICD-10-CM | POA: Diagnosis not present

## 2017-10-30 DIAGNOSIS — H5203 Hypermetropia, bilateral: Secondary | ICD-10-CM | POA: Diagnosis not present

## 2017-10-30 DIAGNOSIS — H52223 Regular astigmatism, bilateral: Secondary | ICD-10-CM | POA: Diagnosis not present

## 2017-10-30 DIAGNOSIS — H40013 Open angle with borderline findings, low risk, bilateral: Secondary | ICD-10-CM | POA: Diagnosis not present

## 2018-02-09 DIAGNOSIS — Z23 Encounter for immunization: Secondary | ICD-10-CM | POA: Diagnosis not present

## 2018-03-11 DIAGNOSIS — L814 Other melanin hyperpigmentation: Secondary | ICD-10-CM | POA: Diagnosis not present

## 2018-03-11 DIAGNOSIS — L819 Disorder of pigmentation, unspecified: Secondary | ICD-10-CM | POA: Diagnosis not present

## 2018-03-11 DIAGNOSIS — D229 Melanocytic nevi, unspecified: Secondary | ICD-10-CM | POA: Diagnosis not present

## 2018-03-11 DIAGNOSIS — D1801 Hemangioma of skin and subcutaneous tissue: Secondary | ICD-10-CM | POA: Diagnosis not present

## 2018-03-11 DIAGNOSIS — L821 Other seborrheic keratosis: Secondary | ICD-10-CM | POA: Diagnosis not present

## 2018-03-11 DIAGNOSIS — D485 Neoplasm of uncertain behavior of skin: Secondary | ICD-10-CM | POA: Diagnosis not present

## 2018-03-12 DIAGNOSIS — D2222 Melanocytic nevi of left ear and external auricular canal: Secondary | ICD-10-CM | POA: Diagnosis not present

## 2018-03-29 DIAGNOSIS — E7800 Pure hypercholesterolemia, unspecified: Secondary | ICD-10-CM | POA: Diagnosis not present

## 2018-03-29 DIAGNOSIS — D126 Benign neoplasm of colon, unspecified: Secondary | ICD-10-CM | POA: Diagnosis not present

## 2018-03-29 DIAGNOSIS — E559 Vitamin D deficiency, unspecified: Secondary | ICD-10-CM | POA: Diagnosis not present

## 2018-03-29 DIAGNOSIS — D241 Benign neoplasm of right breast: Secondary | ICD-10-CM | POA: Diagnosis not present

## 2018-03-29 DIAGNOSIS — Z0000 Encounter for general adult medical examination without abnormal findings: Secondary | ICD-10-CM | POA: Diagnosis not present

## 2018-03-29 DIAGNOSIS — Z1389 Encounter for screening for other disorder: Secondary | ICD-10-CM | POA: Diagnosis not present

## 2018-03-29 DIAGNOSIS — N200 Calculus of kidney: Secondary | ICD-10-CM | POA: Diagnosis not present

## 2018-06-29 ENCOUNTER — Other Ambulatory Visit: Payer: Self-pay | Admitting: Internal Medicine

## 2018-06-29 DIAGNOSIS — Z1231 Encounter for screening mammogram for malignant neoplasm of breast: Secondary | ICD-10-CM

## 2018-07-28 ENCOUNTER — Ambulatory Visit: Payer: PPO

## 2018-10-21 DIAGNOSIS — N281 Cyst of kidney, acquired: Secondary | ICD-10-CM | POA: Diagnosis not present

## 2018-10-21 DIAGNOSIS — R311 Benign essential microscopic hematuria: Secondary | ICD-10-CM | POA: Diagnosis not present

## 2018-10-21 DIAGNOSIS — Z87442 Personal history of urinary calculi: Secondary | ICD-10-CM | POA: Diagnosis not present

## 2018-11-08 ENCOUNTER — Ambulatory Visit
Admission: RE | Admit: 2018-11-08 | Discharge: 2018-11-08 | Disposition: A | Discharge status: Home / Self Care | Payer: PPO | Source: Ambulatory Visit | Attending: Internal Medicine | Admitting: Internal Medicine

## 2018-11-08 ENCOUNTER — Other Ambulatory Visit: Payer: Self-pay

## 2018-11-08 DIAGNOSIS — Z1231 Encounter for screening mammogram for malignant neoplasm of breast: Secondary | ICD-10-CM | POA: Diagnosis not present

## 2018-12-02 DIAGNOSIS — H524 Presbyopia: Secondary | ICD-10-CM | POA: Diagnosis not present

## 2018-12-02 DIAGNOSIS — H52223 Regular astigmatism, bilateral: Secondary | ICD-10-CM | POA: Diagnosis not present

## 2018-12-02 DIAGNOSIS — H5203 Hypermetropia, bilateral: Secondary | ICD-10-CM | POA: Diagnosis not present

## 2019-01-04 DIAGNOSIS — Z23 Encounter for immunization: Secondary | ICD-10-CM | POA: Diagnosis not present

## 2019-01-04 DIAGNOSIS — E669 Obesity, unspecified: Secondary | ICD-10-CM | POA: Diagnosis not present

## 2019-01-04 DIAGNOSIS — R81 Glycosuria: Secondary | ICD-10-CM | POA: Diagnosis not present

## 2019-01-04 DIAGNOSIS — E7800 Pure hypercholesterolemia, unspecified: Secondary | ICD-10-CM | POA: Diagnosis not present

## 2019-01-18 DIAGNOSIS — Z01419 Encounter for gynecological examination (general) (routine) without abnormal findings: Secondary | ICD-10-CM | POA: Diagnosis not present

## 2019-02-11 IMAGING — DX DG CHEST 2V
2 series · 2 of 2 positions shown · non-contrast
Comparison: 09/09/2016

CLINICAL DATA: Cough and congestion

EXAM:
CHEST - 2 VIEW

[dg chest 2 view (1 of 2)]
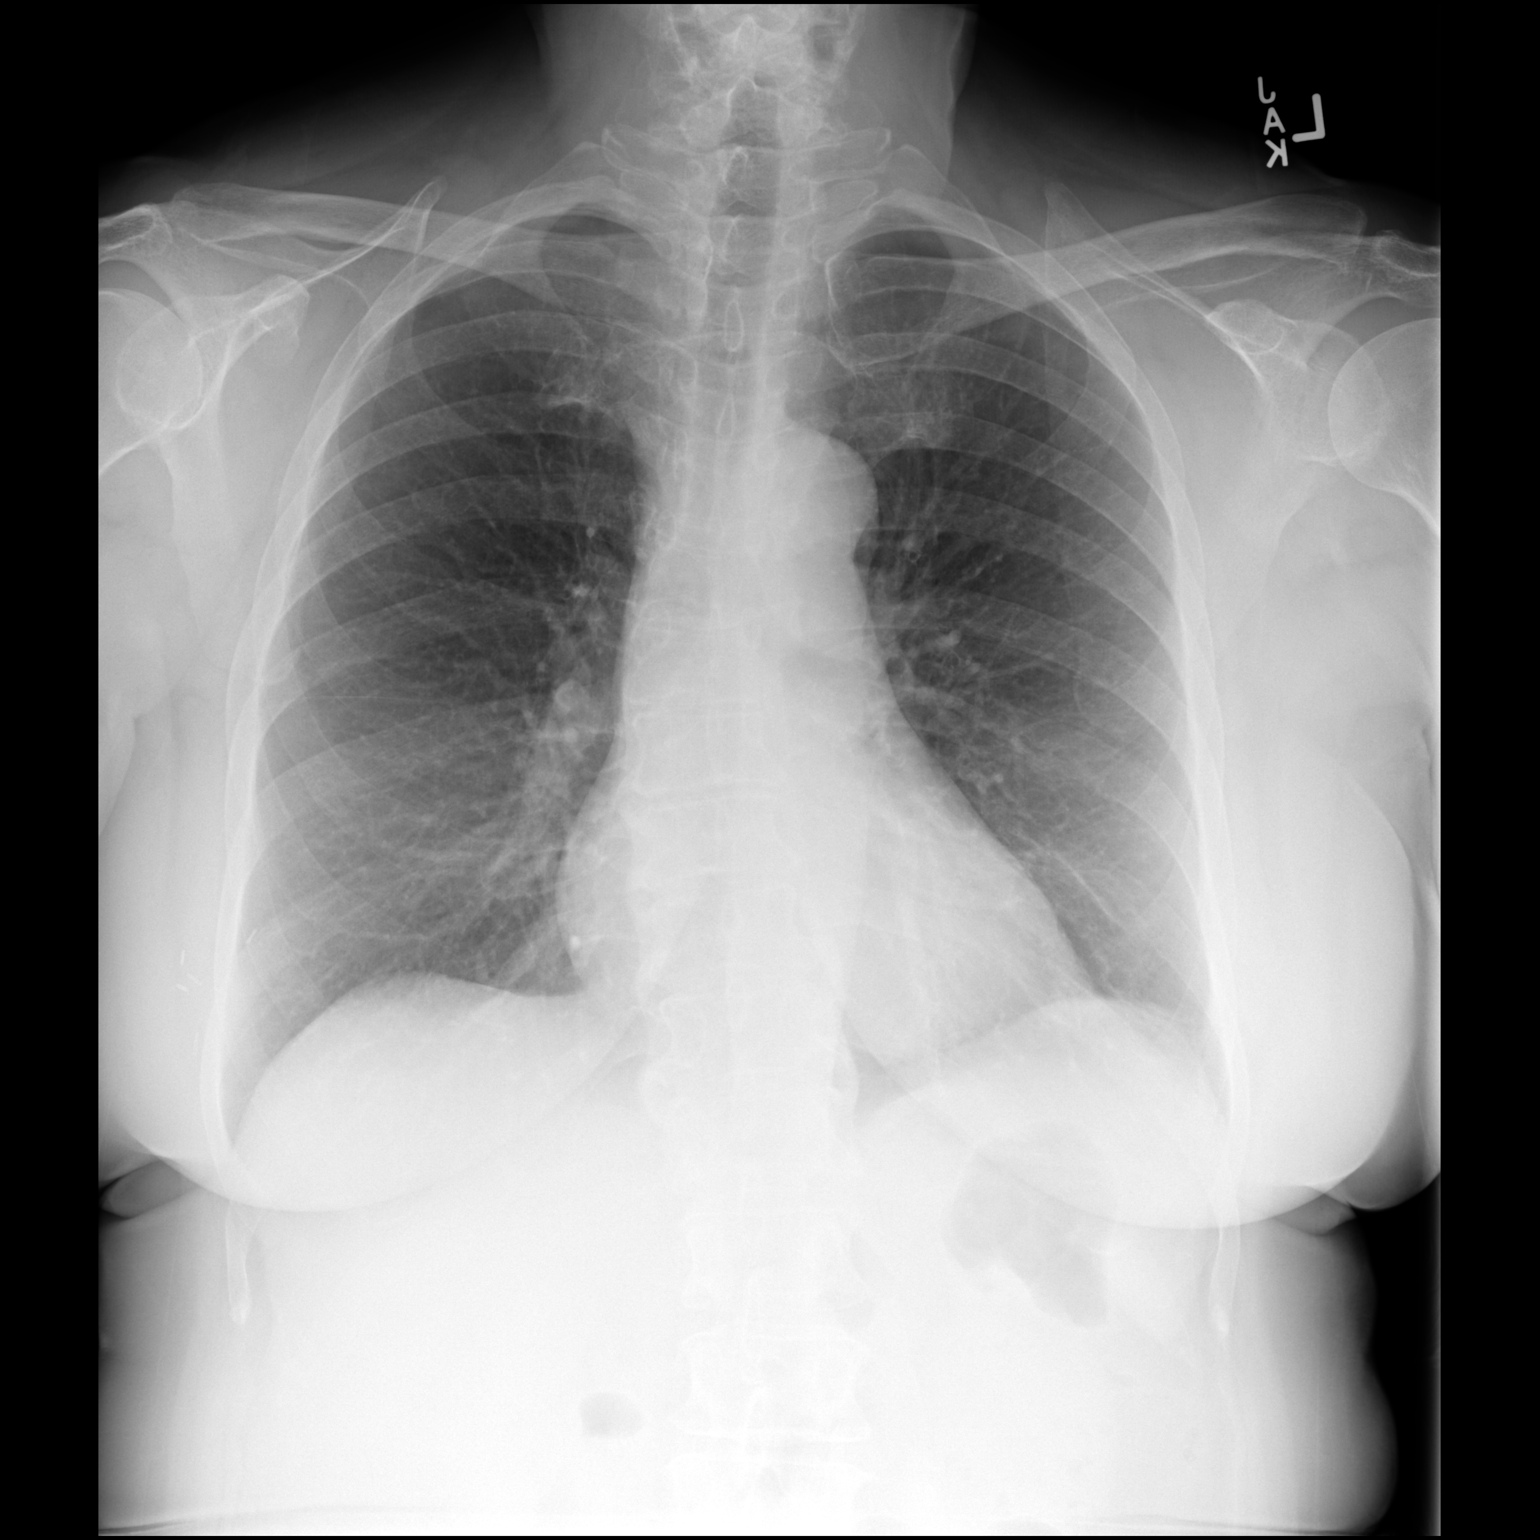

[dg chest 2 view (2 of 2)]
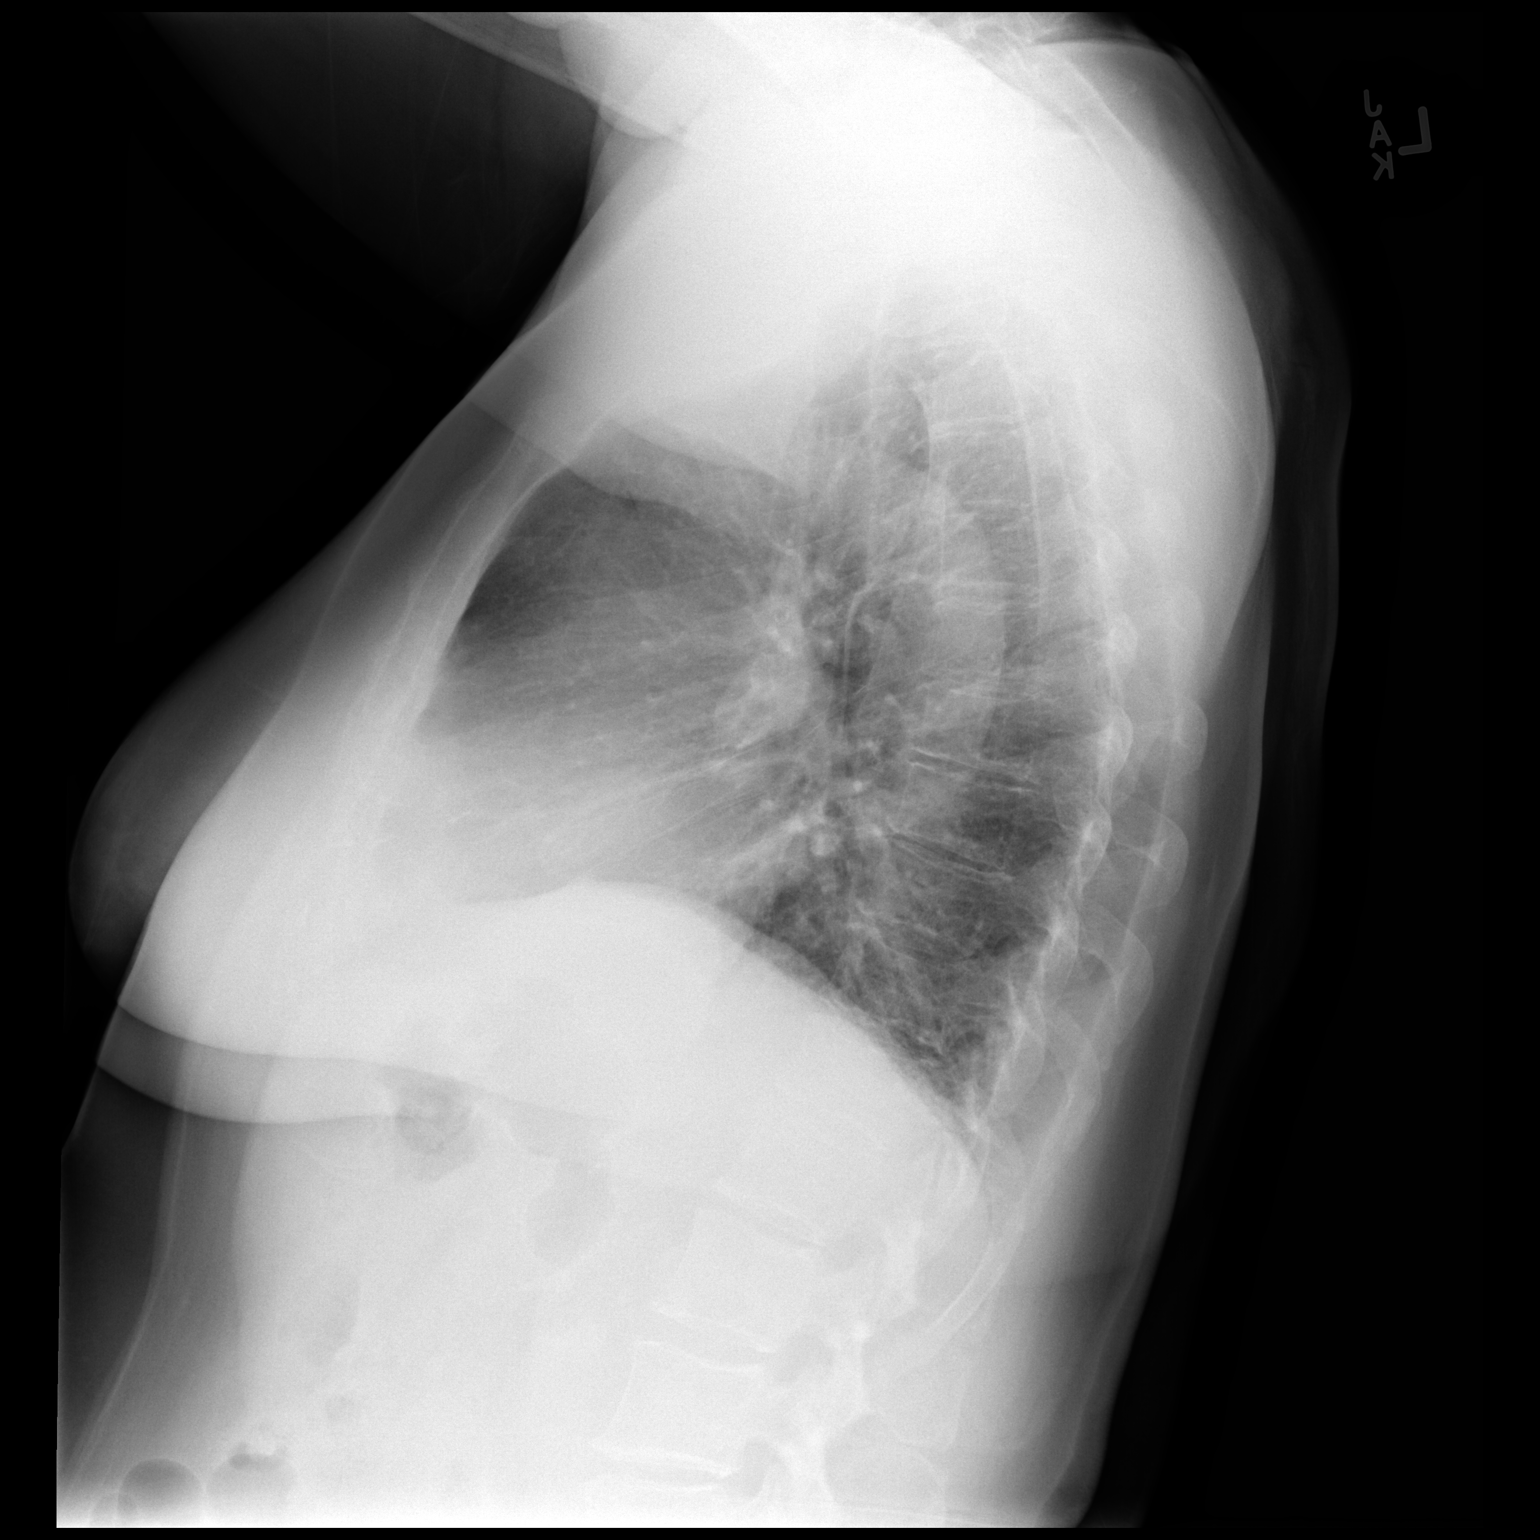

[2 of 2 positions shown; findings below may reference images not displayed]

FINDINGS: Normal heart size and mediastinal contours. No acute infiltrate or
edema. Mild scar-like opacity at the left base. No effusion or
pneumothorax. Mild thoracic dextrocurvature. No acute osseous
findings.
IMPRESSION: No evidence of active disease.

## 2019-03-24 DIAGNOSIS — D485 Neoplasm of uncertain behavior of skin: Secondary | ICD-10-CM | POA: Diagnosis not present

## 2019-03-24 DIAGNOSIS — D229 Melanocytic nevi, unspecified: Secondary | ICD-10-CM | POA: Diagnosis not present

## 2019-03-24 DIAGNOSIS — L819 Disorder of pigmentation, unspecified: Secondary | ICD-10-CM | POA: Diagnosis not present

## 2019-03-24 DIAGNOSIS — L821 Other seborrheic keratosis: Secondary | ICD-10-CM | POA: Diagnosis not present

## 2019-03-24 DIAGNOSIS — L814 Other melanin hyperpigmentation: Secondary | ICD-10-CM | POA: Diagnosis not present

## 2019-04-05 DIAGNOSIS — R7303 Prediabetes: Secondary | ICD-10-CM | POA: Diagnosis not present

## 2019-05-21 ENCOUNTER — Ambulatory Visit: Payer: PPO

## 2019-05-28 ENCOUNTER — Ambulatory Visit: Payer: PPO | Attending: Internal Medicine

## 2019-05-28 DIAGNOSIS — Z23 Encounter for immunization: Secondary | ICD-10-CM | POA: Insufficient documentation

## 2019-05-28 NOTE — Progress Notes (Signed)
   Covid-19 Vaccination Clinic  Name:  Stacy King    MRN: [id number] DOB: 12-09-1945  05/28/2019  [name] was observed post Covid-19 immunization for 15 minutes without incidence. She was provided with Vaccine Information Sheet and instruction to access the V-Safe system.   [name] was instructed to call 911 with any severe reactions post vaccine: Marland Kitchen Difficulty breathing  . Swelling of your face and throat  . A fast heartbeat  . A bad rash all over your body  . Dizziness and weakness    Immunizations Administered    Name Date Dose VIS Date Route   Pfizer COVID-19 Vaccine 05/28/2019  3:46 PM 0.3 mL 04/01/2019 Intramuscular   Manufacturer: [place]   Lot: [id number]   [place]: [id number]

## 2019-06-11 ENCOUNTER — Ambulatory Visit: Payer: PPO

## 2019-06-22 ENCOUNTER — Ambulatory Visit: Payer: PPO | Attending: Internal Medicine

## 2019-06-22 DIAGNOSIS — Z23 Encounter for immunization: Secondary | ICD-10-CM

## 2019-06-22 NOTE — Progress Notes (Signed)
   Covid-19 Vaccination Clinic  Name:  Stacy King    MRN: [id number] DOB: 03/09/1946  06/22/2019  [name] was observed post Covid-19 immunization for 15 minutes without incident. She was provided with Vaccine Information Sheet and instruction to access the V-Safe system.   [name] was instructed to call 911 with any severe reactions post vaccine: Marland Kitchen Difficulty breathing  . Swelling of face and throat  . A fast heartbeat  . A bad rash all over body  . Dizziness and weakness   Immunizations Administered    Name Date Dose VIS Date Route   Pfizer COVID-19 Vaccine 06/22/2019 12:00 PM 0.3 mL 04/01/2019 Intramuscular   Manufacturer: [place]   Lot: [id number]   [place]: [id number]

## 2019-09-26 ENCOUNTER — Other Ambulatory Visit: Payer: Self-pay | Admitting: Family Medicine

## 2019-09-26 DIAGNOSIS — Z1231 Encounter for screening mammogram for malignant neoplasm of breast: Secondary | ICD-10-CM

## 2019-10-11 ENCOUNTER — Other Ambulatory Visit (HOSPITAL_COMMUNITY)
Admission: RE | Admit: 2019-10-11 | Discharge: 2019-10-11 | Disposition: A | Discharge status: Home / Self Care | Payer: PPO | Source: Ambulatory Visit | Attending: Family Medicine | Admitting: Family Medicine

## 2019-10-11 ENCOUNTER — Other Ambulatory Visit: Payer: Self-pay | Admitting: Family Medicine

## 2019-10-11 DIAGNOSIS — Z124 Encounter for screening for malignant neoplasm of cervix: Secondary | ICD-10-CM | POA: Insufficient documentation

## 2019-10-11 DIAGNOSIS — Z0000 Encounter for general adult medical examination without abnormal findings: Secondary | ICD-10-CM | POA: Diagnosis not present

## 2019-10-11 DIAGNOSIS — Z1151 Encounter for screening for human papillomavirus (HPV): Secondary | ICD-10-CM | POA: Diagnosis not present

## 2019-10-11 DIAGNOSIS — R7303 Prediabetes: Secondary | ICD-10-CM | POA: Diagnosis not present

## 2019-10-11 DIAGNOSIS — E7800 Pure hypercholesterolemia, unspecified: Secondary | ICD-10-CM | POA: Diagnosis not present

## 2019-10-11 DIAGNOSIS — Z13220 Encounter for screening for lipoid disorders: Secondary | ICD-10-CM | POA: Diagnosis not present

## 2019-10-11 DIAGNOSIS — Z1211 Encounter for screening for malignant neoplasm of colon: Secondary | ICD-10-CM | POA: Diagnosis not present

## 2019-10-13 LAB — CYTOLOGY - PAP
Comment: NEGATIVE
Diagnosis: NEGATIVE
High risk HPV: NEGATIVE

## 2019-10-28 DIAGNOSIS — N281 Cyst of kidney, acquired: Secondary | ICD-10-CM | POA: Diagnosis not present

## 2019-10-28 DIAGNOSIS — Q446 Cystic disease of liver: Secondary | ICD-10-CM | POA: Diagnosis not present

## 2019-10-28 DIAGNOSIS — R311 Benign essential microscopic hematuria: Secondary | ICD-10-CM | POA: Diagnosis not present

## 2019-11-02 ENCOUNTER — Encounter: Payer: Self-pay | Admitting: Gastroenterology

## 2019-11-09 ENCOUNTER — Ambulatory Visit
Admission: RE | Admit: 2019-11-09 | Discharge: 2019-11-09 | Disposition: A | Discharge status: Home / Self Care | Payer: PPO | Source: Ambulatory Visit | Attending: Family Medicine | Admitting: Family Medicine

## 2019-11-09 ENCOUNTER — Other Ambulatory Visit: Payer: Self-pay

## 2019-11-09 DIAGNOSIS — Z1231 Encounter for screening mammogram for malignant neoplasm of breast: Secondary | ICD-10-CM | POA: Diagnosis not present

## 2019-12-28 ENCOUNTER — Ambulatory Visit: Payer: PPO | Admitting: Gastroenterology

## 2019-12-28 ENCOUNTER — Encounter: Payer: Self-pay | Admitting: Gastroenterology

## 2019-12-28 VITALS — BP 116/64 | HR 72 | Ht 62.5 in | Wt 174.8 lb

## 2019-12-28 DIAGNOSIS — K7689 Other specified diseases of liver: Secondary | ICD-10-CM

## 2019-12-28 NOTE — Patient Instructions (Addendum)
If you are age 73 or older, your body mass index should be between 23-30. Your Body mass index is 31.46 kg/m. If this is out of the aforementioned range listed, please consider follow up with your Primary Care Provider.  If you are age 51 or younger, your body mass index should be between 19-25. Your Body mass index is 31.46 kg/m. If this is out of the aformentioned range listed, please consider follow up with your Primary Care Provider.   You have been scheduled for an MRI at [facility] on 01-04-20. Your appointment time is 3:00pm. Please arrive 15 minutes prior to your appointment time for registration purposes. Please make certain not to have anything to eat or drink 4 hours prior to your test. In addition, if you have any metal in your body, have a pacemaker or defibrillator, please be sure to let your ordering physician know. This test typically takes 45 minutes to 1 hour to complete. Should you need to reschedule, please call [phone] to do so.  Due to recent changes in healthcare laws, you may see the results of your imaging and laboratory studies on MyChart before your provider has had a chance to review them.  We understand that in some cases there may be results that are confusing or concerning to you. Not all laboratory results come back in the same time frame and the provider may be waiting for multiple results in order to interpret others.  Please give [place] 48 hours in order for your provider to thoroughly review all the results before contacting the office for clarification of your results.    Thank you for entrusting me with your care and choosing [facility].  Dr Ardis Hughs

## 2019-12-28 NOTE — Progress Notes (Signed)
HPI: This is a very pleasant 74 year old woman who was referred by her urologist Dr. Lovena Neighbours for growing hepatic cysts  Her urologist sent her in because recent serial abdominal ultrasounds which he had been performing showed that the dominant liver cyst has been growing.  I was able to review a handwritten report from ultrasound 2020 and also ultrasound 2021.  In 2020 dominant cyst was 10 cm, ultrasound 2021 the dominant cyst was 12.6 cm.  She does not have significant abdominal pains.  She has no right upper quadrant pains.  Her weight is overall stable.  Liver disease does not run in her family.  Old Data Reviewed: Hepatobiliary findings (CT scan abdomen pelvis with and without IV contrast March 2019, indication "microscopic hematuria, some associated pelvic pain, no nausea, vomiting, fever or weight loss".   :   Multiple well-defined low-attenuation nonenhancing lesions, largest of which is in the inferior aspect of the right lobe of the liver measuring 9.7 x 7.6 cm. Several other subcentimeter low-attenuation lesions are too small to definitively characterize, but are statistically likely to represent a tiny cyst. In addition, in the periphery of segment 7 (axial image 14 of series 5) there is a 1.6 x 2.0 cm centrally low-attenuation peripherally enhancing lesion which demonstrates progressive centripetal filling, diagnostic of a cavernous hemangioma. Another cavernous hemangioma measuring 1.8 x 3.8 cm is also noted in segment 2 of the liver (axial image 19 of series 5). No other suspicious appearing hepatic lesions. No intra or extrahepatic biliary ductal dilatation. Gallbladder is normal in appearance.  CT scan abdomen pelvis 2011 also noted multiple hepatic cysts.    Review of systems: Pertinent positive and negative review of systems were noted in the above HPI section. All other review negative.   Past Medical History:  Diagnosis Date  . Abnormal mammogram of right breast  06/03/2016  . Anal fissure   . Arthritis    arthritis  . GERD (gastroesophageal reflux disease)    was on meds 8+ yrs ago  . History of bronchitis 15 yrs ago  . History of colon polyps    benign  . History of kidney stones    x1    Past Surgical History:  Procedure Laterality Date  . BREAST EXCISIONAL BIOPSY Right    benign  . BREAST LUMPECTOMY WITH RADIOACTIVE SEED LOCALIZATION Right 06/03/2016   Procedure: RIGHT BREAST LUMPECTOMY WITH RADIOACTIVE SEED LOCALIZATION;  Surgeon: Fanny Skates, MD;  Location: [facility];  Service: General;  Laterality: Right;  . CESAREAN SECTION     x1  . COLONOSCOPY WITH PROPOFOL N/A 01/02/2015   Procedure: COLONOSCOPY WITH PROPOFOL;  Surgeon: Garlan Fair, MD;  Location: [facility];  Service: Endoscopy;  Laterality: N/A;  . CYSTOSCOPY W/ STONE MANIPULATION     2011  . SOFT TISSUE BIOPSY Bilateral 1979/2003   axilla area-benign    Current Outpatient Medications  Medication Sig Dispense Refill  . APPLE CIDER VINEGAR PO Take 2 capsules by mouth daily.    Marland Kitchen b complex vitamins tablet Take 1 tablet by mouth daily.    Marland Kitchen BLACK ELDERBERRY PO Take 2 capsules by mouth daily.    . Calcium Carb-Cholecalciferol (CALTRATE 600+D) 600-800 MG-UNIT TABS Take 1 tablet by mouth every morning.    . Cholecalciferol (VITAMIN D3) 2000 units capsule Take 2,000 Units by mouth daily.    Marland Kitchen ibuprofen (ADVIL,MOTRIN) 200 MG tablet Take 600 mg by mouth daily as needed for headache or moderate pain.     . Magnesium  500 MG TABS Take 500 mg by mouth daily.     . Multiple Vitamins-Minerals (HAIR SKIN AND NAILS FORMULA) TABS Take 2 tablets by mouth daily.     . Turmeric (QC TUMERIC COMPLEX) 500 MG CAPS Take 2 capsules by mouth daily.     No current facility-administered medications for this visit.    Allergies as of 12/28/2019 - Review Complete 12/28/2019  Allergen Reaction Noted  . Cipro [ciprofloxacin hcl]  12/27/2019  . Erythromycin  12/27/2019  . Sulfa antibiotics   12/27/2019    Family History  Problem Relation Age of Onset  . Heart attack Father   . Diabetes Father   . Heart disease Father   . Colon polyps Father   . Diabetes Mother   . Diabetes Sister     Social History   Socioeconomic History  . Marital status: Single    Spouse name: Not on file  . Number of children: Not on file  . Years of education: Not on file  . Highest education level: Not on file  Occupational History  . Not on file  Tobacco Use  . Smoking status: Current Every Day Smoker    Packs/day: 0.25  . Smokeless tobacco: Never Used  Vaping Use  . Vaping Use: Never used  Substance and Sexual Activity  . Alcohol use: Yes    Comment: rare may wine on social  . Drug use: No  . Sexual activity: Not on file  Other Topics Concern  . Not on file  Social History Narrative  . Not on file   Social Determinants of Health   Financial Resource Strain:   . Difficulty of Paying Living Expenses: Not on file  Food Insecurity:   . Worried About Charity fundraiser in the Last Year: Not on file  . Ran Out of Food in the Last Year: Not on file  Transportation Needs:   . Lack of Transportation (Medical): Not on file  . Lack of Transportation (Non-Medical): Not on file  Physical Activity:   . Days of Exercise per Week: Not on file  . Minutes of Exercise per Session: Not on file  Stress:   . Feeling of Stress : Not on file  Social Connections:   . Frequency of Communication with Friends and Family: Not on file  . Frequency of Social Gatherings with Friends and Family: Not on file  . Attends Religious Services: Not on file  . Active Member of Clubs or Organizations: Not on file  . Attends Archivist Meetings: Not on file  . Marital Status: Not on file  Intimate Partner Violence:   . Fear of Current or Ex-Partner: Not on file  . Emotionally Abused: Not on file  . Physically Abused: Not on file  . Sexually Abused: Not on file     Physical Exam: Wt 174 lb  12.8 oz (79.3 kg)   BMI 30.96 kg/m  Constitutional: generally well-appearing Psychiatric: alert and oriented x3 Eyes: extraocular movements intact Mouth: oral pharynx moist, no lesions Neck: supple no lymphadenopathy Cardiovascular: heart regular rate and rhythm Lungs: clear to auscultation bilaterally Abdomen: soft, nontender, nondistended, no obvious ascites, no peritoneal signs, normal bowel sounds Extremities: no lower extremity edema bilaterally Skin: no lesions on visible extremities   Assessment and plan: 74 y.o. female with incidental hepatic cysts  First I explained to her that she was found to have cysts in her liver as far back as 2011 by CAT scan.  The dominant cyst  was measured quite well by CAT scan 2019 at which time it was about 9 cm.  Since then abdominal ultrasound imaging has been performed by her urologist and the dominant liver cyst might have grown.  Most recently the dominant hepatic cyst was measured at 12.6 cm.  She does not have any symptoms attributable to the cysts.  She has never had MRI evaluation of her liver I recommend that we go ahead with that now to make sure that we are truly dealing with hepatic cysts and nothing else as MRI really is the best imaging modality of the liver.  I see no reason for any further blood tests or imaging studies prior to then.   Please see the "Patient Instructions" section for addition details about the plan.   Owens Loffler, MD [facility] 12/28/2019, 11:04 AM  Cc: Lanice Shirts, *  Total time on date of encounter was 45  minutes (this included time spent preparing to see the patient reviewing records; obtaining and/or reviewing separately obtained history; performing a medically appropriate exam and/or evaluation; counseling and educating the patient and family if present; ordering medications, tests or procedures if applicable; and documenting clinical information in the health record).

## 2020-01-04 ENCOUNTER — Ambulatory Visit (HOSPITAL_COMMUNITY)
Admission: RE | Admit: 2020-01-04 | Discharge: 2020-01-04 | Disposition: A | Discharge status: Home / Self Care | Payer: PPO | Source: Ambulatory Visit | Attending: Gastroenterology | Admitting: Gastroenterology

## 2020-01-04 ENCOUNTER — Other Ambulatory Visit: Payer: Self-pay

## 2020-01-04 DIAGNOSIS — K7689 Other specified diseases of liver: Secondary | ICD-10-CM

## 2020-01-04 DIAGNOSIS — K760 Fatty (change of) liver, not elsewhere classified: Secondary | ICD-10-CM | POA: Diagnosis not present

## 2020-01-04 DIAGNOSIS — D1809 Hemangioma of other sites: Secondary | ICD-10-CM | POA: Diagnosis not present

## 2020-01-04 MED ORDER — GADOBUTROL 1 MMOL/ML IV SOLN
7.5000 mL | Freq: Once | INTRAVENOUS | Status: AC | PRN
  Administered 2020-01-04: 7.5 mL via INTRAVENOUS

## 2020-01-18 DIAGNOSIS — Z23 Encounter for immunization: Secondary | ICD-10-CM | POA: Diagnosis not present

## 2020-03-08 IMAGING — MG DIGITAL SCREENING BILATERAL MAMMOGRAM WITH TOMO AND CAD
8 series · 8 of 24 positions shown · non-contrast
Comparison: Previous exam(s).

CLINICAL DATA: Screening.

EXAM:
DIGITAL SCREENING BILATERAL MAMMOGRAM WITH TOMO AND CAD

[R CC synth-2D]
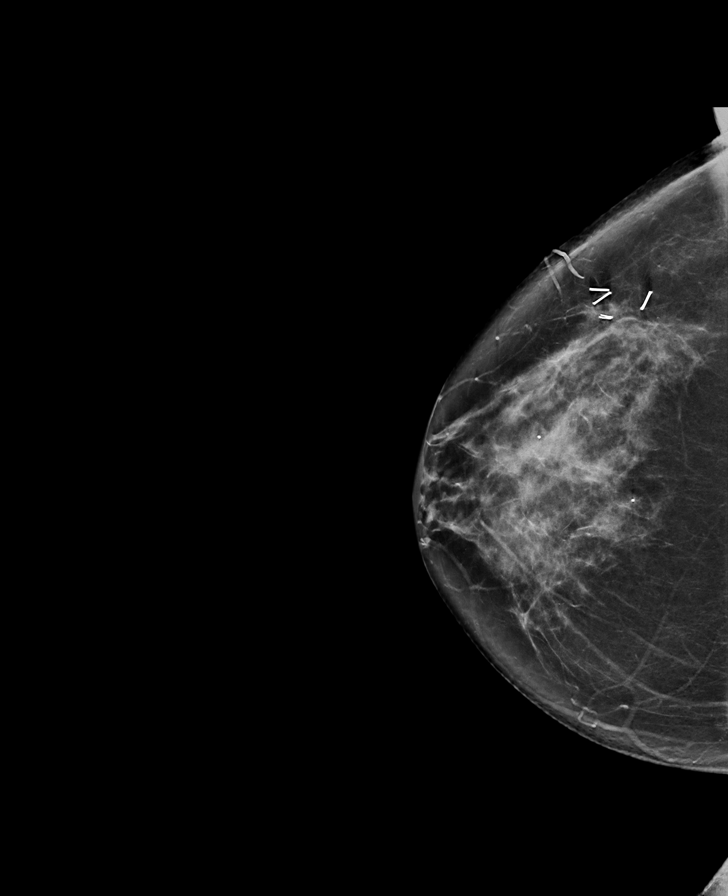

[L CC synth-2D]
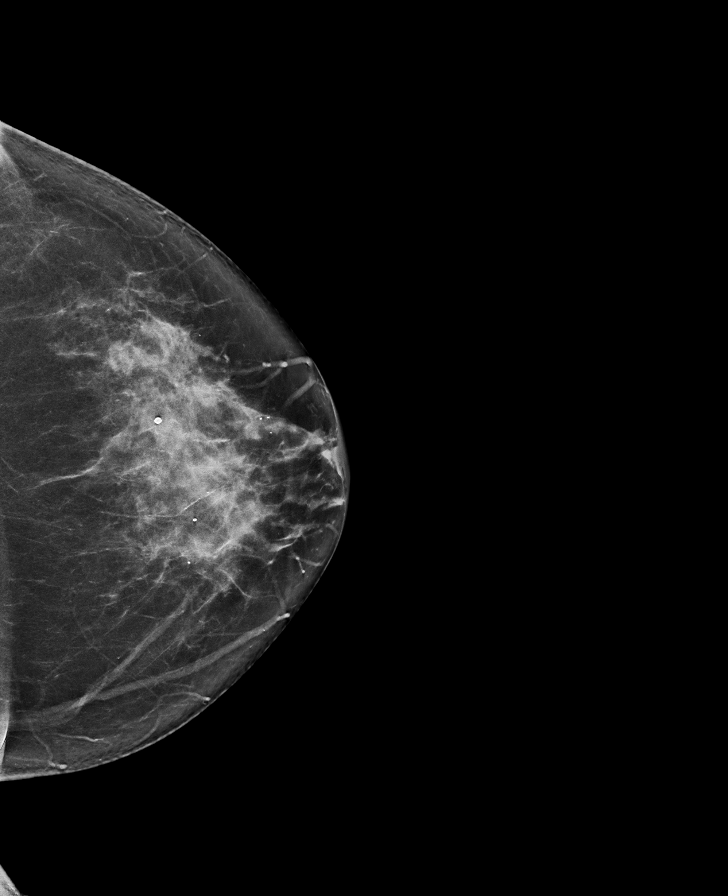

[L MLO synth-2D]
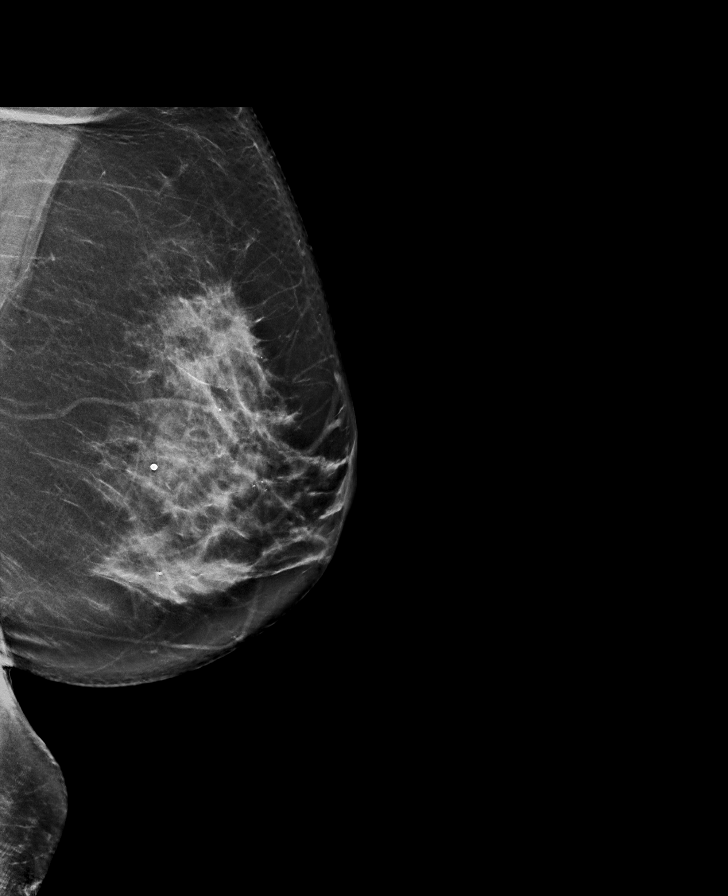

[R MLO synth-2D]
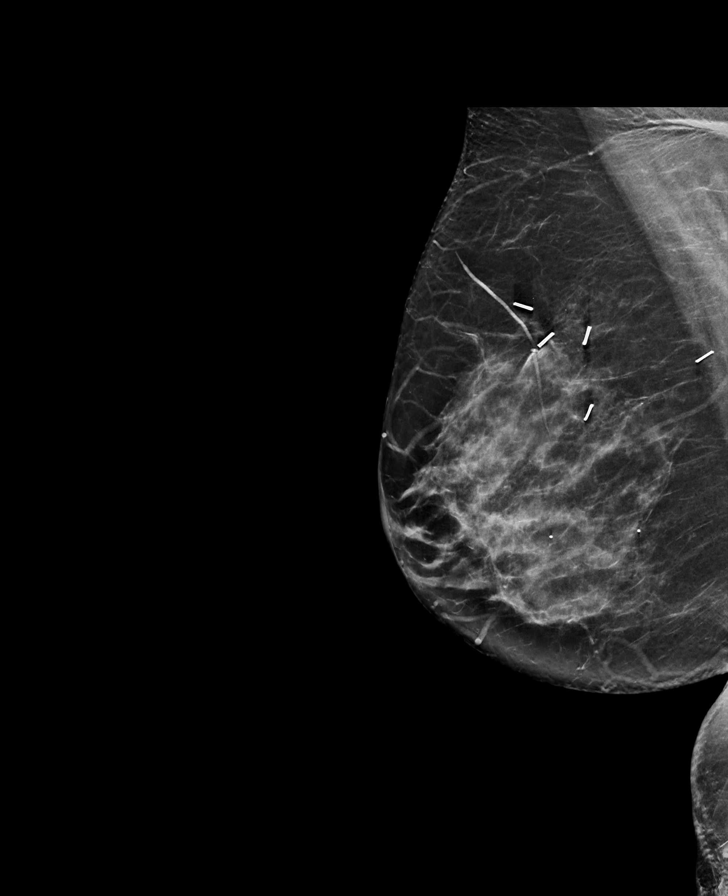

[R CC tomo · tomo slice 39/76.0]
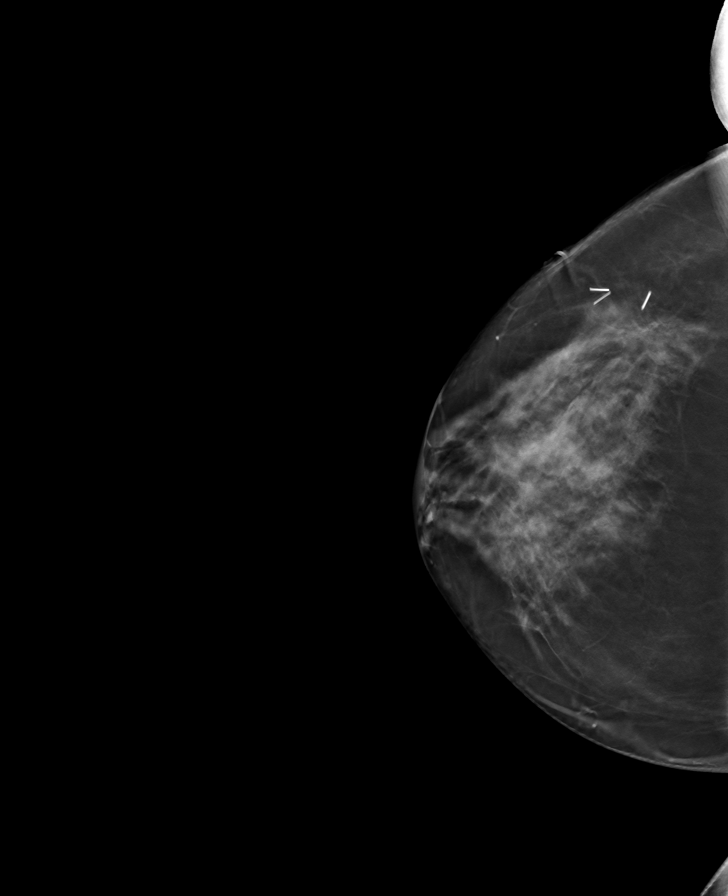

[L CC tomo · tomo slice 41/80.0]
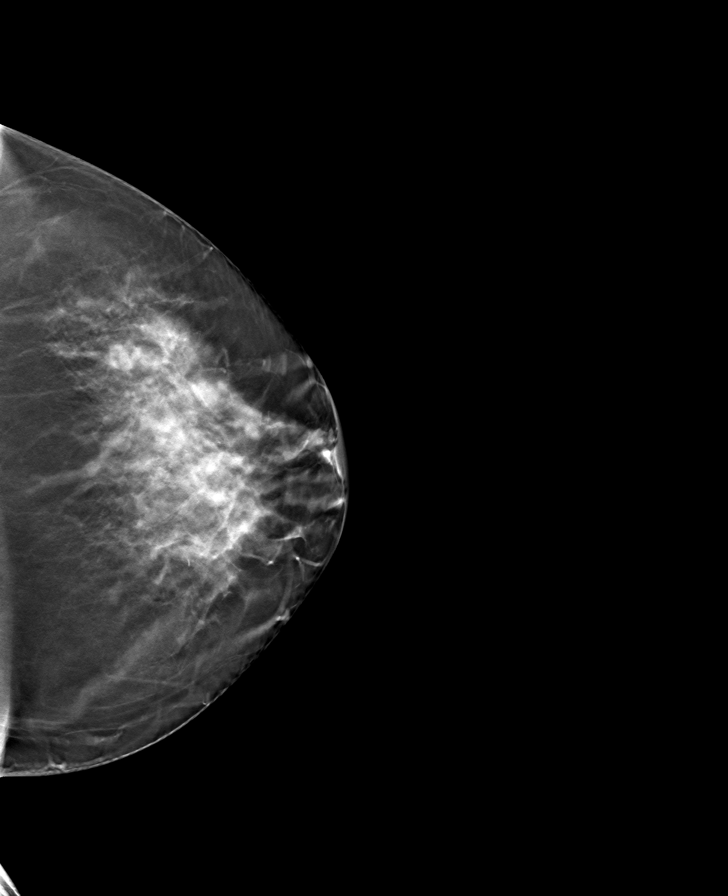

[R MLO tomo · tomo slice 39/76.0]
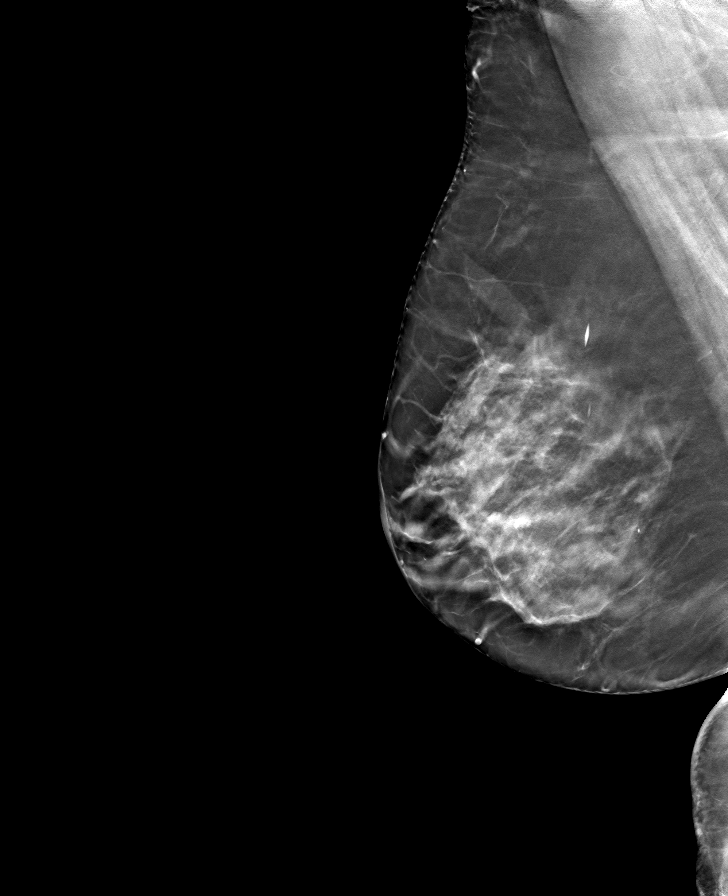

[L MLO tomo · tomo slice 42/83.0]
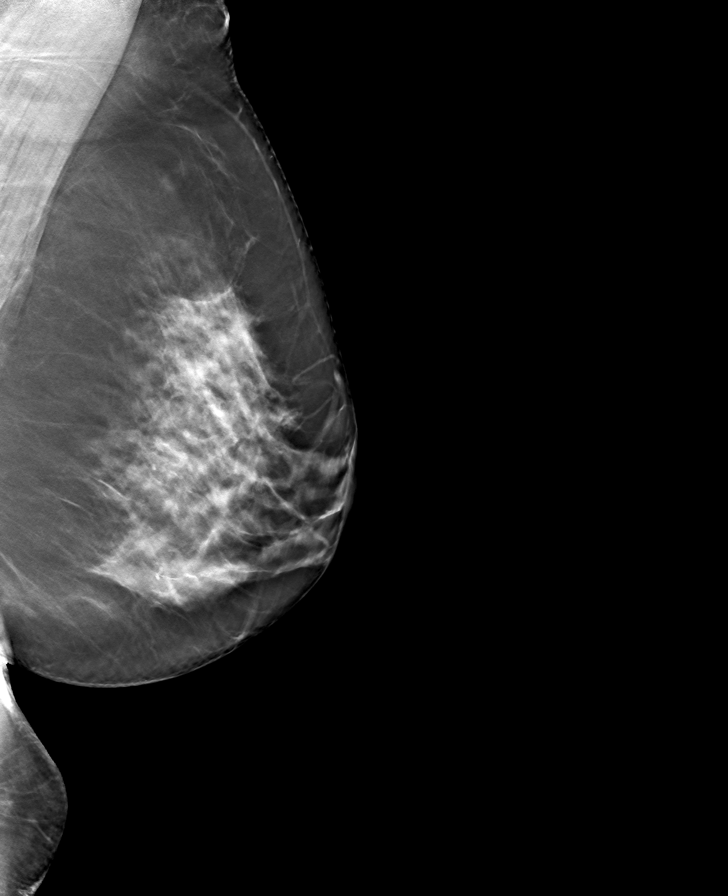

[8 of 24 positions shown; findings below may reference images not displayed]

ACR Breast Density Category c: The breast tissue is heterogeneously
dense, which may obscure small masses.
FINDINGS: There are no findings suspicious for malignancy. Images were
processed with CAD.
IMPRESSION: No mammographic evidence of malignancy. A result letter of this
screening mammogram will be mailed directly to the patient.

RECOMMENDATION:
Screening mammogram in one year. (Code:[id number]-U-LHB)

BI-RADS CATEGORY  1: Negative.

## 2020-04-02 DIAGNOSIS — L820 Inflamed seborrheic keratosis: Secondary | ICD-10-CM | POA: Diagnosis not present

## 2020-04-02 DIAGNOSIS — D1801 Hemangioma of skin and subcutaneous tissue: Secondary | ICD-10-CM | POA: Diagnosis not present

## 2020-04-02 DIAGNOSIS — L821 Other seborrheic keratosis: Secondary | ICD-10-CM | POA: Diagnosis not present

## 2020-04-02 DIAGNOSIS — L814 Other melanin hyperpigmentation: Secondary | ICD-10-CM | POA: Diagnosis not present

## 2020-04-02 DIAGNOSIS — D229 Melanocytic nevi, unspecified: Secondary | ICD-10-CM | POA: Diagnosis not present

## 2020-04-02 DIAGNOSIS — L819 Disorder of pigmentation, unspecified: Secondary | ICD-10-CM | POA: Diagnosis not present

## 2020-04-02 DIAGNOSIS — I8393 Asymptomatic varicose veins of bilateral lower extremities: Secondary | ICD-10-CM | POA: Diagnosis not present

## 2020-04-05 ENCOUNTER — Emergency Department (HOSPITAL_COMMUNITY)
Admission: EM | Admit: 2020-04-05 | Discharge: 2020-04-05 | Disposition: A | Discharge status: Home / Self Care | Payer: PPO | Attending: Emergency Medicine | Admitting: Emergency Medicine

## 2020-04-05 ENCOUNTER — Emergency Department (HOSPITAL_COMMUNITY): Payer: PPO

## 2020-04-05 ENCOUNTER — Other Ambulatory Visit: Payer: Self-pay

## 2020-04-05 DIAGNOSIS — R4020 Unspecified coma: Secondary | ICD-10-CM | POA: Diagnosis not present

## 2020-04-05 DIAGNOSIS — R55 Syncope and collapse: Secondary | ICD-10-CM | POA: Diagnosis not present

## 2020-04-05 DIAGNOSIS — R000 Tachycardia, unspecified: Secondary | ICD-10-CM | POA: Diagnosis not present

## 2020-04-05 DIAGNOSIS — R0902 Hypoxemia: Secondary | ICD-10-CM | POA: Diagnosis not present

## 2020-04-05 DIAGNOSIS — J90 Pleural effusion, not elsewhere classified: Secondary | ICD-10-CM | POA: Diagnosis not present

## 2020-04-05 DIAGNOSIS — I959 Hypotension, unspecified: Secondary | ICD-10-CM | POA: Diagnosis not present

## 2020-04-05 LAB — BASIC METABOLIC PANEL
Anion gap: 12 (ref 5–15)
BUN: 16 mg/dL (ref 8–23)
CO2: 26 mmol/L (ref 22–32)
Calcium: 9.4 mg/dL (ref 8.9–10.3)
Chloride: 99 mmol/L (ref 98–111)
Creatinine, Ser: 0.84 mg/dL (ref 0.44–1.00)
GFR, Estimated: >60 mL/min (ref >60)
Glucose, Bld: 125 mg/dL — ABNORMAL HIGH (ref 70–99)
Potassium: 3.8 mmol/L (ref 3.5–5.1)
Sodium: 137 mmol/L (ref 135–145)

## 2020-04-05 LAB — CBC
HCT: 46.1 % — ABNORMAL HIGH (ref 36.0–46.0)
Hemoglobin: 14.5 g/dL (ref 12.0–15.0)
MCH: 29.8 pg (ref 26.0–34.0)
MCHC: 31.5 g/dL (ref 30.0–36.0)
MCV: 94.7 fL (ref 80.0–100.0)
Platelets: 242 10*3/uL (ref 150–400)
RBC: 4.87 MIL/uL (ref 3.87–5.11)
RDW: 12.8 % (ref 11.5–15.5)
WBC: 8.4 10*3/uL (ref 4.0–10.5)
nRBC: 0 % (ref 0.0–0.2)

## 2020-04-05 LAB — TSH: TSH: 1.141 u[IU]/mL (ref 0.350–4.500)

## 2020-04-05 NOTE — ED Notes (Signed)
Pt able to walk on the hallway with steady gait, no c/o dizziness at this time.

## 2020-04-05 NOTE — ED Provider Notes (Signed)
[facility] EMERGENCY DEPARTMENT Provider Note   CSN: [id number] Arrival date & time: 04/05/20  1137     History Chief Complaint  Patient presents with  . Loss of Consciousness    Stacy King is a 74 y.o. female.  Patient is a 74 year old female with a history of GERD and prediabetes who reports multiple of episodes of syncope when she was younger approximately 30 years ago presenting today with an episode of syncope while getting her nails done. Patient reports she woke up this morning and she was feeling her normal self. She woke up a bit late and did not have anything to eat prior to going to the nail salon. She reports she was seen there getting the polish taken off when she suddenly started not feeling well. She described it as a lightheaded and dizzy feeling. She told her nail technician that she needed something to drink and she was not feeling good. She reports then somebody went out to get her a muffin and she went to take a bite and the next thing she remembers is waking up on the floor. EMS reports when the first medic arrived patient was unresponsive with bradycardia in the 40s which improved without intervention. Patient then woke up and started talking asking for something to eat and drink. She denied having any post syncopal nausea or vomiting. Prior to or after the event she denied any chest pain, palpitations, shortness of breath. She reports she has been eating and drinking regularly. She does not use drugs or alcohol. She does not take any prescription medications but does take multiple vitamins. She follows up with her doctor regularly and has been fairly healthy. She has not had recent fever, cough or congestion. At this time she does report that she is hungry but otherwise feels her normal self. While sitting in the waiting room she reported she felt slightly lightheaded but that has since passed.  The history is provided by the patient.       Past  Medical History:  Diagnosis Date  . Abnormal mammogram of right breast 06/03/2016  . Anal fissure   . Arthritis    arthritis  . GERD (gastroesophageal reflux disease)    was on meds 8+ yrs ago  . History of bronchitis 15 yrs ago  . History of colon polyps    benign  . History of kidney stones    x1    Patient Active Problem List   Diagnosis Date Noted  . Abnormal mammogram of right breast 06/03/2016    Past Surgical History:  Procedure Laterality Date  . BREAST EXCISIONAL BIOPSY Right    benign  . BREAST LUMPECTOMY WITH RADIOACTIVE SEED LOCALIZATION Right 06/03/2016   Procedure: RIGHT BREAST LUMPECTOMY WITH RADIOACTIVE SEED LOCALIZATION;  Surgeon: Fanny Skates, MD;  Location: [facility];  Service: General;  Laterality: Right;  . CESAREAN SECTION     x1  . COLONOSCOPY WITH PROPOFOL N/A 01/02/2015   Procedure: COLONOSCOPY WITH PROPOFOL;  Surgeon: Garlan Fair, MD;  Location: [facility];  Service: Endoscopy;  Laterality: N/A;  . CYSTOSCOPY W/ STONE MANIPULATION     2011  . SOFT TISSUE BIOPSY Bilateral 1979/2003   axilla area-benign     OB History   No obstetric history on file.     Family History  Problem Relation Age of Onset  . Heart attack Father   . Diabetes Father   . Heart disease Father   . Colon polyps Father   .  Diabetes Mother   . Diabetes Sister     Social History   Tobacco Use  . Smoking status: Never Smoker  . Smokeless tobacco: Never Used  Vaping Use  . Vaping Use: Never used  Substance Use Topics  . Alcohol use: Yes    Comment: rare may wine on social  . Drug use: No    Home Medications Prior to Admission medications   Medication Sig Start Date End Date Taking? Authorizing Provider  APPLE CIDER VINEGAR PO Take 2 capsules by mouth daily.    [provider]  b complex vitamins tablet Take 1 tablet by mouth daily.    [provider]  BLACK ELDERBERRY PO Take 2 capsules by mouth daily.    [provider]  Calcium  Carb-Cholecalciferol (CALTRATE 600+D) 600-800 MG-UNIT TABS Take 1 tablet by mouth every morning.    [provider]  Cholecalciferol (VITAMIN D3) 2000 units capsule Take 2,000 Units by mouth daily.    [provider]  ibuprofen (ADVIL,MOTRIN) 200 MG tablet Take 600 mg by mouth daily as needed for headache or moderate pain.     [provider]  Magnesium 500 MG TABS Take 500 mg by mouth daily.     [provider]  Multiple Vitamins-Minerals (HAIR SKIN AND NAILS FORMULA) TABS Take 2 tablets by mouth daily.     [provider]  Turmeric (QC TUMERIC COMPLEX) 500 MG CAPS Take 2 capsules by mouth daily.    [provider]    Allergies    Cipro [ciprofloxacin hcl], Erythromycin, and Sulfa antibiotics  Review of Systems   Review of Systems  All other systems reviewed and are negative.   Physical Exam Updated Vital Signs BP (!) 130/56   Pulse 65   Temp (!) 97.5 F (36.4 C)   Resp 18   SpO2 100%   Physical Exam Vitals and nursing note reviewed.  Constitutional:      General: She is not in acute distress.    Appearance: Normal appearance. She is well-developed, normal weight and well-nourished.  HENT:     Head: Normocephalic and atraumatic.  Eyes:     Extraocular Movements: EOM normal.     Pupils: Pupils are equal, round, and reactive to light.  Cardiovascular:     Rate and Rhythm: Normal rate and regular rhythm.     Pulses: Intact distal pulses.     Heart sounds: Normal heart sounds. No murmur heard. No friction rub.  Pulmonary:     Effort: Pulmonary effort is normal.     Breath sounds: Normal breath sounds. No wheezing or rales.  Abdominal:     General: Bowel sounds are normal. There is no distension.     Palpations: Abdomen is soft.     Tenderness: There is no abdominal tenderness. There is no guarding or rebound.  Musculoskeletal:        General: No tenderness. Normal range of motion.     Right lower leg: No edema.      Left lower leg: No edema.     Comments: No edema  Skin:    General: Skin is warm and dry.     Findings: No rash.  Neurological:     General: No focal deficit present.     Mental Status: She is alert and oriented to person, place, and time. Mental status is at baseline.     Cranial Nerves: No cranial nerve deficit.     Sensory: No sensory deficit.  Motor: No weakness.     Gait: Gait normal.  Psychiatric:        Mood and Affect: Mood and affect and mood normal.        Behavior: Behavior normal.        Thought Content: Thought content normal.     ED Results / Procedures / Treatments   Labs (all labs ordered are listed, but only abnormal results are displayed) Labs Reviewed  BASIC METABOLIC PANEL - Abnormal; Notable for the following components:      Result Value   Glucose, Bld 125 (*)    All other components within normal limits  CBC - Abnormal; Notable for the following components:   HCT 46.1 (*)    All other components within normal limits  URINALYSIS, ROUTINE W REFLEX MICROSCOPIC  CBG MONITORING, ED    EKG EKG Interpretation  Date/Time:  Thursday April 05 2020 11:39:05 EST Ventricular Rate:  62 PR Interval:  156 QRS Duration: 84 QT Interval:  412 QTC Calculation: 418 R Axis:   11 Text Interpretation: Normal sinus rhythm T wave abnormality, consider anterior ischemia No significant change since last tracing Confirmed by Blanchie Dessert ([id number]) on 04/05/2020 12:54:54 PM   Radiology DG Chest Port 1 View  Result Date: 04/05/2020 CLINICAL DATA:  sycnope EXAM: PORTABLE CHEST 1 VIEW COMPARISON:  10/13/2017 and prior. FINDINGS: No pneumothorax or pleural effusion. No focal consolidation. Prominence of the cardiac silhouette may be secondary to mild hypoinflation and AP technique. No acute osseous abnormality. IMPRESSION: No focal airspace disease. Electronically Signed   By: Primitivo Gauze M.D.   On: 04/05/2020 13:15    Procedures Procedures (including  critical care time)  Medications Ordered in ED Medications - No data to display  ED Course  I have reviewed the triage vital signs and the nursing notes.  Pertinent labs & imaging results that were available during my care of the patient were reviewed by me and considered in my medical decision making (see chart for details).    MDM Rules/Calculators/A&P                          Elderly female presenting today after a syncopal event at the nail salon. Patient had prodromal symptoms and is currently asymptomatic. It was reported that she had a heart rate in the 40s on initial EMS arrival but it improved to the 60s quickly without intervention. Patient does not take any medications that would lower heart rate or blood pressure. She has not had any recent infections or concern for infectious etiology. She does not have any unilateral lateral leg pain or swelling. She has no evidence of tachycardia and lower suspicion for PE at this time. She denied any chest pain or concerns for ACS. Will ensure normal blood sugar. EKG without evidence of dysrhythmia with normal QT and no signs concerning for Brugada's or WPW. She does have some T wave abnormalities in the anterior leads which is unchanged from 2018. Will give patient something to eat. We'll continue to monitor patient's heart rate and blood pressure. Labs are pending.  2:41 PM Patient's labs are unremarkable.  She is still feeling normal.  Chest x-ray within normal limits.  Suspect a vagal event given the lower heart rate when EMS arrived.  Patient remains asymptomatic and was able to stand and ambulate without symptoms.  Feel that it is reasonable for patient to be discharged home.  Discussed follow-up with her PCP for any  further events.  MDM Number of Diagnoses or Management Options   Amount and/or Complexity of Data Reviewed Clinical lab tests: ordered and reviewed Tests in the radiology section of CPT: ordered and reviewed Tests in the  medicine section of CPT: ordered and reviewed Decide to obtain previous medical records or to obtain history from someone other than the patient: yes Obtain history from someone other than the patient: yes Review and summarize past medical records: yes Discuss the patient with other providers: no Independent visualization of images, tracings, or specimens: yes  Risk of Complications, Morbidity, and/or Mortality Presenting problems: moderate Diagnostic procedures: low Management options: low  Patient Progress Patient progress: improved   Final Clinical Impression(s) / ED Diagnoses Final diagnoses:  Syncope and collapse  Vasovagal syncope    Rx / DC Orders ED Discharge Orders    None       Blanchie Dessert, MD 04/05/20 1446

## 2020-04-05 NOTE — ED Notes (Signed)
Discharge instructions provided to patient. Verbalized understanding. Alert and oriented. IV lock removed. Ambulated with steady gait out of ED.

## 2020-04-05 NOTE — Discharge Instructions (Signed)
Make sure you are eating and staying hydrated.  If you develop any chest pain, shortness of breath, abdominal pain or any further epsiodes of passing out return to the ER.

## 2020-04-05 NOTE — ED Triage Notes (Signed)
Pt via EMS from [place] for syncopal event while getting her nails done. When the first medic arrived she was completely unresponsive with blue lips. Initially bradycardic in 40s but improved without improvement.

## 2020-04-30 DIAGNOSIS — R7303 Prediabetes: Secondary | ICD-10-CM | POA: Diagnosis not present

## 2020-04-30 DIAGNOSIS — E7800 Pure hypercholesterolemia, unspecified: Secondary | ICD-10-CM | POA: Diagnosis not present

## 2020-08-02 DIAGNOSIS — D123 Benign neoplasm of transverse colon: Secondary | ICD-10-CM | POA: Diagnosis not present

## 2020-08-02 DIAGNOSIS — Z86010 Personal history of colonic polyps: Secondary | ICD-10-CM | POA: Diagnosis not present

## 2020-08-02 DIAGNOSIS — K602 Anal fissure, unspecified: Secondary | ICD-10-CM | POA: Diagnosis not present

## 2020-08-02 DIAGNOSIS — K635 Polyp of colon: Secondary | ICD-10-CM | POA: Diagnosis not present

## 2020-08-02 DIAGNOSIS — D128 Benign neoplasm of rectum: Secondary | ICD-10-CM | POA: Diagnosis not present

## 2020-08-02 DIAGNOSIS — K648 Other hemorrhoids: Secondary | ICD-10-CM | POA: Diagnosis not present

## 2020-08-09 DIAGNOSIS — K635 Polyp of colon: Secondary | ICD-10-CM | POA: Diagnosis not present

## 2020-08-09 DIAGNOSIS — D128 Benign neoplasm of rectum: Secondary | ICD-10-CM | POA: Diagnosis not present

## 2020-08-09 DIAGNOSIS — D123 Benign neoplasm of transverse colon: Secondary | ICD-10-CM | POA: Diagnosis not present

## 2020-09-25 ENCOUNTER — Other Ambulatory Visit: Payer: Self-pay | Admitting: Family Medicine

## 2020-09-25 DIAGNOSIS — Z1231 Encounter for screening mammogram for malignant neoplasm of breast: Secondary | ICD-10-CM

## 2020-10-17 DIAGNOSIS — R7303 Prediabetes: Secondary | ICD-10-CM | POA: Diagnosis not present

## 2020-10-17 DIAGNOSIS — H9193 Unspecified hearing loss, bilateral: Secondary | ICD-10-CM | POA: Diagnosis not present

## 2020-10-17 DIAGNOSIS — K7689 Other specified diseases of liver: Secondary | ICD-10-CM | POA: Diagnosis not present

## 2020-10-17 DIAGNOSIS — Z0000 Encounter for general adult medical examination without abnormal findings: Secondary | ICD-10-CM | POA: Diagnosis not present

## 2020-10-17 DIAGNOSIS — H6121 Impacted cerumen, right ear: Secondary | ICD-10-CM | POA: Diagnosis not present

## 2020-10-17 DIAGNOSIS — E7800 Pure hypercholesterolemia, unspecified: Secondary | ICD-10-CM | POA: Diagnosis not present

## 2020-11-09 DIAGNOSIS — R8271 Bacteriuria: Secondary | ICD-10-CM | POA: Diagnosis not present

## 2020-11-09 DIAGNOSIS — Q446 Cystic disease of liver: Secondary | ICD-10-CM | POA: Diagnosis not present

## 2020-11-09 DIAGNOSIS — N281 Cyst of kidney, acquired: Secondary | ICD-10-CM | POA: Diagnosis not present

## 2020-11-09 DIAGNOSIS — R311 Benign essential microscopic hematuria: Secondary | ICD-10-CM | POA: Diagnosis not present

## 2020-12-10 ENCOUNTER — Other Ambulatory Visit: Payer: Self-pay

## 2020-12-10 ENCOUNTER — Ambulatory Visit
Admission: RE | Admit: 2020-12-10 | Discharge: 2020-12-10 | Disposition: A | Discharge status: Home / Self Care | Payer: PPO | Source: Ambulatory Visit | Attending: Family Medicine | Admitting: Family Medicine

## 2020-12-10 DIAGNOSIS — Z1231 Encounter for screening mammogram for malignant neoplasm of breast: Secondary | ICD-10-CM

## 2020-12-12 ENCOUNTER — Other Ambulatory Visit: Payer: Self-pay | Admitting: Family Medicine

## 2020-12-12 DIAGNOSIS — R928 Other abnormal and inconclusive findings on diagnostic imaging of breast: Secondary | ICD-10-CM

## 2020-12-20 HISTORY — PX: BREAST BIOPSY: SHX20

## 2020-12-25 DIAGNOSIS — H903 Sensorineural hearing loss, bilateral: Secondary | ICD-10-CM | POA: Diagnosis not present

## 2020-12-28 ENCOUNTER — Ambulatory Visit: Payer: PPO

## 2020-12-28 ENCOUNTER — Other Ambulatory Visit: Payer: Self-pay

## 2020-12-28 ENCOUNTER — Ambulatory Visit
Admission: RE | Admit: 2020-12-28 | Discharge: 2020-12-28 | Disposition: A | Discharge status: Home / Self Care | Payer: PPO | Source: Ambulatory Visit | Attending: Family Medicine | Admitting: Family Medicine

## 2020-12-28 ENCOUNTER — Other Ambulatory Visit: Payer: Self-pay | Admitting: Family Medicine

## 2020-12-28 DIAGNOSIS — R928 Other abnormal and inconclusive findings on diagnostic imaging of breast: Secondary | ICD-10-CM

## 2020-12-28 DIAGNOSIS — R922 Inconclusive mammogram: Secondary | ICD-10-CM | POA: Diagnosis not present

## 2021-01-14 ENCOUNTER — Other Ambulatory Visit: Payer: Self-pay

## 2021-01-14 ENCOUNTER — Ambulatory Visit
Admission: RE | Admit: 2021-01-14 | Discharge: 2021-01-14 | Disposition: A | Discharge status: Home / Self Care | Payer: PPO | Source: Ambulatory Visit | Attending: Family Medicine | Admitting: Family Medicine

## 2021-01-14 DIAGNOSIS — N6012 Diffuse cystic mastopathy of left breast: Secondary | ICD-10-CM | POA: Diagnosis not present

## 2021-01-14 DIAGNOSIS — R928 Other abnormal and inconclusive findings on diagnostic imaging of breast: Secondary | ICD-10-CM | POA: Diagnosis not present

## 2021-01-17 ENCOUNTER — Telehealth: Payer: Self-pay

## 2021-01-17 NOTE — Telephone Encounter (Signed)
Left message on machine to call back  

## 2021-01-17 NOTE — Telephone Encounter (Signed)
-----   Message from Timothy Lasso, RN sent at 01/14/2021 10:12 AM EDT ----- repeat MRI of the liver in 12 months is reasonable   ----- Message ----- From: Timothy Lasso, RN Sent: 01/14/2021  12:00 AM EDT To: Timothy Lasso, RN   ----- Message ----- From: Timothy Lasso, RN Sent: 01/09/2021  12:00 AM EDT To: Timothy Lasso, RN   repeat MRI of the liver in 12 months is reasonable

## 2021-01-18 NOTE — Telephone Encounter (Signed)
Left message on machine to call back  

## 2021-01-21 NOTE — Telephone Encounter (Signed)
The MRI shows no sign of cancer.  I think repeat MRI of the liver in 12 months is reasonable.  My office will arrange.  Please call if you have any questions or concerns.  Written by Milus Banister, MD on 01/10/2020 12:57 PM EDT Seen by patient Stacy King on 01/10/2020  1:11 PM

## 2021-01-21 NOTE — Telephone Encounter (Signed)
Pt returned your call. She received your letter through [facility]. Pt called complaining that she never spoke with Dr. Ardis Hughs last year about her imaging results. She was under the impression that she was going to speak directly with him besides his nurse. Pt states that she waited for two weeks for his call. Pls call to clarify this misunderstanding/miscommunication with pt. Pls try her cell first and if no answer, try her house phone.

## 2021-01-21 NOTE — Telephone Encounter (Signed)
Unable to reach the pt by phone will mail letter

## 2021-01-21 NOTE — Telephone Encounter (Signed)
See note below the pt received and read the MyChart message from Dr Ardis Hughs 12/2019.  I tried to reach the pt at both numbers (home and cell) no answer message left.  I will also send a My Chart message.

## 2021-01-21 NOTE — Telephone Encounter (Signed)
Dr Ardis Hughs the pt returned call and states she is disgusted with this office and with you because she was not called with results of her MRI last year.  I see the MRI results and the message that was sent to her with the results.  There is a note that she viewed the message on 01/10/20 at 1:11 pm.  I notified her that it looks like she saw this message and she tells me she never saw it.  She then says Dr Ardis Hughs told me that "HE" was going to call me personally to discuss and she is appalled that he did not do what he said. She then says " he has time to spend the money I paid but not to call me."  She says that we are not the only GI practice in [place] and she thinks she would feel more comfortable at another practice.

## 2021-01-22 ENCOUNTER — Other Ambulatory Visit: Payer: Self-pay | Admitting: Surgery

## 2021-01-22 DIAGNOSIS — N6489 Other specified disorders of breast: Secondary | ICD-10-CM

## 2021-01-23 ENCOUNTER — Other Ambulatory Visit: Payer: Self-pay | Admitting: Surgery

## 2021-01-23 DIAGNOSIS — N6489 Other specified disorders of breast: Secondary | ICD-10-CM

## 2021-02-19 HISTORY — PX: BREAST EXCISIONAL BIOPSY: SUR124

## 2021-02-21 DIAGNOSIS — Z23 Encounter for immunization: Secondary | ICD-10-CM | POA: Diagnosis not present

## 2021-02-28 ENCOUNTER — Other Ambulatory Visit: Payer: Self-pay

## 2021-02-28 ENCOUNTER — Encounter (HOSPITAL_BASED_OUTPATIENT_CLINIC_OR_DEPARTMENT_OTHER): Payer: Self-pay | Admitting: Surgery

## 2021-03-05 NOTE — Progress Notes (Signed)

## 2021-03-06 ENCOUNTER — Ambulatory Visit
Admission: RE | Admit: 2021-03-06 | Discharge: 2021-03-06 | Disposition: A | Discharge status: Home / Self Care | Payer: PPO | Source: Ambulatory Visit | Attending: Surgery | Admitting: Surgery

## 2021-03-06 ENCOUNTER — Other Ambulatory Visit: Payer: Self-pay

## 2021-03-06 DIAGNOSIS — R928 Other abnormal and inconclusive findings on diagnostic imaging of breast: Secondary | ICD-10-CM | POA: Diagnosis not present

## 2021-03-06 DIAGNOSIS — N6489 Other specified disorders of breast: Secondary | ICD-10-CM

## 2021-03-06 NOTE — H&P (Signed)
REFERRING PHYSICIAN:  Caren Macadam, MD   PROVIDER:  Beverlee Nims, MD   MRN: [id number] DOB: May 11, 1945  Subjective  Chief Complaint: SURGERY CONSULT     History of Present Illness: Stacy King is a 75 y.o. female who is seen  as an office consultation at the request of Dr. Mannie Stabile for evaluation of [place]  .     This is a 75 year old female referred here for evaluation of a complex sclerosing lesion of the left breast.  She had distortion seen on mammogram the left breast.  A biopsy was performed showing a complex sclerosing lesion.  She has had a right breast lumpectomy in 2018 for the same thing.  She is otherwise without complaint regarding her breast.  She did have a syncopal episode during seed placement in 2018.  She is quite concerned about this and her pain threshold.  She has no previous history of breast cancer.   Review of Systems: A complete review of systems was obtained from the patient.  I have reviewed this information and discussed as appropriate with the patient.  See HPI as well for other ROS.   ROS    Medical History: Past Medical History      Past Medical History:  Diagnosis Date   Arthritis     GERD (gastroesophageal reflux disease)          There is no problem list on file for this patient.     Past Surgical History       Past Surgical History:  Procedure Laterality Date   BENIGN KNOTS BOTH ARMS       CESAREAN SECTION       DISTORTION REMOVED FROM BREAST            Allergies      Allergies  Allergen Reactions   Ciprofloxacin Hcl Nausea   Erythromycin Nausea              Current Outpatient Medications on File Prior to Visit  Medication Sig Dispense Refill   calcium carbonate-vitamin D3 600 mg-20 mcg (800 unit) Tab Take by mouth       cholecalciferol (VITAMIN D3) 2,000 unit capsule Take by mouth       ibuprofen (MOTRIN) 200 MG tablet Take by mouth       magnesium oxide 500 mg Tab Take by mouth        multivit-min-folic acid-biotin 62.8-3,662 mcg Tab Take 1 tablet by mouth       VITAMIN B COMPLEX ORAL Take 1 tablet by mouth once daily        No current facility-administered medications on file prior to visit.      Family History       Family History  Problem Relation Age of Onset   Diabetes Mother     Stroke Father     High blood pressure (Hypertension) Father     Hyperlipidemia (Elevated cholesterol) Father     Heart valve disease Father     Diabetes Father     Diabetes Sister          Social History       Tobacco Use  Smoking Status Never Smoker  Smokeless Tobacco Never Used      Social History  Social History        Socioeconomic History   Marital status: Single  Tobacco Use   Smoking status: Never Smoker   Smokeless tobacco: Never Used  Scientific laboratory technician Use:  Never used  Substance and Sexual Activity   Alcohol use: Never   Drug use: Never        Objective:       Vitals:      BP: (!) 148/72  Pulse: 94  Temp: 36.3 C (97.3 F)  SpO2: 96%  Weight: 80.1 kg (176 lb 9.6 oz)  Height: 158.8 cm (5' 2.5")    Body mass index is 31.79 kg/m.   Physical Exam    She appears well on exam   There is mild ecchymosis from the biopsy of the left breast.  There are no palpable breast masses and no axillary adenopathy  Lungs clear CV RRR Abdomen soft,NT   Labs, Imaging and Diagnostic Testing: I have reviewed her pathology results as well as her mammogram   Assessment and Plan:  Diagnoses and all orders for this visit:   Complex sclerosing lesion of left breast       I reviewed her notes in the electronic medical records.  I have reviewed her previous operative report.  I gave her a copy of the pathology results.  She has a complex sclerosing lesion of the left breast.  Again, this is similar to her right breast.  A radioactive seed guided left breast lumpectomy is strongly recommended to remove this area to rule out malignancy.  We again discussed  the surgical procedure and its risk in detail which were similar to her previous surgery.  She understands and wished to proceed again with surgery which will be scheduled

## 2021-03-07 ENCOUNTER — Encounter (HOSPITAL_BASED_OUTPATIENT_CLINIC_OR_DEPARTMENT_OTHER): Payer: Self-pay | Admitting: Surgery

## 2021-03-07 ENCOUNTER — Ambulatory Visit (HOSPITAL_BASED_OUTPATIENT_CLINIC_OR_DEPARTMENT_OTHER): Payer: PPO | Admitting: Anesthesiology

## 2021-03-07 ENCOUNTER — Ambulatory Visit (HOSPITAL_BASED_OUTPATIENT_CLINIC_OR_DEPARTMENT_OTHER)
Admission: RE | Admit: 2021-03-07 | Discharge: 2021-03-07 | Disposition: A | Discharge status: Home / Self Care | Payer: PPO | Attending: Surgery | Admitting: Surgery

## 2021-03-07 ENCOUNTER — Encounter (HOSPITAL_BASED_OUTPATIENT_CLINIC_OR_DEPARTMENT_OTHER)
Admission: RE | Disposition: A | Discharge status: Home / Self Care | Payer: Self-pay | Source: Home / Self Care | Attending: Surgery

## 2021-03-07 ENCOUNTER — Other Ambulatory Visit: Payer: Self-pay

## 2021-03-07 ENCOUNTER — Ambulatory Visit
Admission: RE | Admit: 2021-03-07 | Discharge: 2021-03-07 | Disposition: A | Discharge status: Home / Self Care | Payer: PPO | Source: Ambulatory Visit | Attending: Surgery | Admitting: Surgery

## 2021-03-07 DIAGNOSIS — N6012 Diffuse cystic mastopathy of left breast: Secondary | ICD-10-CM | POA: Diagnosis not present

## 2021-03-07 DIAGNOSIS — N6082 Other benign mammary dysplasias of left breast: Secondary | ICD-10-CM | POA: Insufficient documentation

## 2021-03-07 DIAGNOSIS — N6489 Other specified disorders of breast: Secondary | ICD-10-CM | POA: Insufficient documentation

## 2021-03-07 DIAGNOSIS — N62 Hypertrophy of breast: Secondary | ICD-10-CM | POA: Diagnosis not present

## 2021-03-07 DIAGNOSIS — M1990 Unspecified osteoarthritis, unspecified site: Secondary | ICD-10-CM | POA: Diagnosis not present

## 2021-03-07 DIAGNOSIS — R928 Other abnormal and inconclusive findings on diagnostic imaging of breast: Secondary | ICD-10-CM | POA: Diagnosis not present

## 2021-03-07 DIAGNOSIS — K219 Gastro-esophageal reflux disease without esophagitis: Secondary | ICD-10-CM | POA: Diagnosis not present

## 2021-03-07 HISTORY — PX: BREAST LUMPECTOMY WITH RADIOACTIVE SEED LOCALIZATION: SHX6424

## 2021-03-07 SURGERY — BREAST LUMPECTOMY WITH RADIOACTIVE SEED LOCALIZATION
Anesthesia: General | Site: Breast | Laterality: Left

## 2021-03-07 MED ORDER — CHLORHEXIDINE GLUCONATE CLOTH 2 % EX PADS: 6.0000 | MEDICATED_PAD | Freq: Once | CUTANEOUS | Status: DC

## 2021-03-07 MED ORDER — CEFAZOLIN SODIUM-DEXTROSE 2-4 GM/100ML-% IV SOLN
2.0000 g | INTRAVENOUS | Status: AC
  Administered 2021-03-07: 08:00:00 2 g via INTRAVENOUS

## 2021-03-07 MED ORDER — LIDOCAINE HCL (CARDIAC) PF 100 MG/5ML IV SOSY
PREFILLED_SYRINGE | INTRAVENOUS | Status: DC | PRN
  Administered 2021-03-07: 40 mg via INTRATRACHEAL

## 2021-03-07 MED ORDER — LACTATED RINGERS IV SOLN: INTRAVENOUS | Status: DC

## 2021-03-07 MED ORDER — DEXAMETHASONE SODIUM PHOSPHATE 10 MG/ML IJ SOLN
INTRAMUSCULAR | Status: DC | PRN
Start: 2021-03-07 — End: 2021-03-07
  Administered 2021-03-07: 5 mg via INTRAVENOUS

## 2021-03-07 MED ORDER — HYDROMORPHONE HCL 1 MG/ML IJ SOLN
INTRAMUSCULAR | Status: AC
  Filled 2021-03-07: qty 0.5

## 2021-03-07 MED ORDER — ACETAMINOPHEN 500 MG PO TABS
ORAL_TABLET | ORAL | Status: AC
  Filled 2021-03-07: qty 2

## 2021-03-07 MED ORDER — HYDROMORPHONE HCL 1 MG/ML IJ SOLN
0.2500 mg | INTRAMUSCULAR | Status: DC | PRN
  Administered 2021-03-07 (×2): 0.5 mg via INTRAVENOUS

## 2021-03-07 MED ORDER — AMISULPRIDE (ANTIEMETIC) 5 MG/2ML IV SOLN
10.0000 mg | Freq: Once | INTRAVENOUS | Status: AC | PRN
  Administered 2021-03-07: 09:00:00 10 mg via INTRAVENOUS

## 2021-03-07 MED ORDER — AMISULPRIDE (ANTIEMETIC) 5 MG/2ML IV SOLN
INTRAVENOUS | Status: AC
  Filled 2021-03-07: qty 4

## 2021-03-07 MED ORDER — MIDAZOLAM HCL 2 MG/2ML IJ SOLN
INTRAMUSCULAR | Status: AC
  Filled 2021-03-07: qty 2

## 2021-03-07 MED ORDER — ONDANSETRON HCL 4 MG/2ML IJ SOLN
INTRAMUSCULAR | Status: AC
  Filled 2021-03-07: qty 2

## 2021-03-07 MED ORDER — FENTANYL CITRATE (PF) 100 MCG/2ML IJ SOLN
INTRAMUSCULAR | Status: DC | PRN
  Administered 2021-03-07: 50 ug via INTRAVENOUS

## 2021-03-07 MED ORDER — CEFAZOLIN SODIUM-DEXTROSE 2-4 GM/100ML-% IV SOLN
INTRAVENOUS | Status: AC
  Filled 2021-03-07: qty 100

## 2021-03-07 MED ORDER — OXYCODONE HCL 5 MG PO TABS: 5.0000 mg | ORAL_TABLET | Freq: Once | ORAL | Status: DC | PRN

## 2021-03-07 MED ORDER — ONDANSETRON HCL 4 MG/2ML IJ SOLN
4.0000 mg | Freq: Once | INTRAMUSCULAR | Status: AC
  Administered 2021-03-07: 12:00:00 4 mg via INTRAVENOUS

## 2021-03-07 MED ORDER — ONDANSETRON HCL 4 MG/2ML IJ SOLN
INTRAMUSCULAR | Status: DC | PRN
  Administered 2021-03-07: 4 mg via INTRAVENOUS

## 2021-03-07 MED ORDER — ACETAMINOPHEN 500 MG PO TABS
1000.0000 mg | ORAL_TABLET | ORAL | Status: AC
  Administered 2021-03-07: 07:00:00 1000 mg via ORAL

## 2021-03-07 MED ORDER — PROMETHAZINE HCL 25 MG/ML IJ SOLN: 6.2500 mg | INTRAMUSCULAR | Status: DC | PRN

## 2021-03-07 MED ORDER — BUPIVACAINE-EPINEPHRINE 0.5% -1:200000 IJ SOLN
INTRAMUSCULAR | Status: DC | PRN
  Administered 2021-03-07: 15 mL

## 2021-03-07 MED ORDER — OXYCODONE HCL 5 MG/5ML PO SOLN: 5.0000 mg | Freq: Once | ORAL | Status: DC | PRN

## 2021-03-07 MED ORDER — PROPOFOL 10 MG/ML IV BOLUS
INTRAVENOUS | Status: DC | PRN
  Administered 2021-03-07: 140 mg via INTRAVENOUS

## 2021-03-07 MED ORDER — PROPOFOL 10 MG/ML IV BOLUS
INTRAVENOUS | Status: AC
  Filled 2021-03-07: qty 20

## 2021-03-07 MED ORDER — FENTANYL CITRATE (PF) 100 MCG/2ML IJ SOLN
INTRAMUSCULAR | Status: AC
  Filled 2021-03-07: qty 2

## 2021-03-07 MED ORDER — TRAMADOL HCL 50 MG PO TABS: 50.0000 mg | ORAL_TABLET | Freq: Four times a day (QID) | ORAL | 0 refills | Status: AC | PRN

## 2021-03-07 MED ORDER — MIDAZOLAM HCL 5 MG/5ML IJ SOLN
INTRAMUSCULAR | Status: DC | PRN
  Administered 2021-03-07: 1 mg via INTRAVENOUS

## 2021-03-07 SURGICAL SUPPLY — 50 items
ADH SKN CLS APL DERMABOND .7 (GAUZE/BANDAGES/DRESSINGS) ×1
APL PRP STRL LF DISP 70% ISPRP (MISCELLANEOUS) ×1
APPLIER CLIP 9.375 MED OPEN (MISCELLANEOUS)
APR CLP MED 9.3 20 MLT OPN (MISCELLANEOUS)
BINDER BREAST 3XL (GAUZE/BANDAGES/DRESSINGS) IMPLANT
BINDER BREAST LRG (GAUZE/BANDAGES/DRESSINGS) IMPLANT
BINDER BREAST MEDIUM (GAUZE/BANDAGES/DRESSINGS) IMPLANT
BINDER BREAST XLRG (GAUZE/BANDAGES/DRESSINGS) IMPLANT
BINDER BREAST XXLRG (GAUZE/BANDAGES/DRESSINGS) ×1 IMPLANT
BLADE SURG 15 STRL LF DISP TIS (BLADE) ×1 IMPLANT
BLADE SURG 15 STRL SS (BLADE) ×2
CANISTER SUC SOCK COL 7IN (MISCELLANEOUS) IMPLANT
CANISTER SUCT 1200ML W/VALVE (MISCELLANEOUS) IMPLANT
CHLORAPREP W/TINT 26 (MISCELLANEOUS) ×2 IMPLANT
CLIP APPLIE 9.375 MED OPEN (MISCELLANEOUS) IMPLANT
COVER BACK TABLE 60X90IN (DRAPES) ×2 IMPLANT
COVER MAYO STAND STRL (DRAPES) ×2 IMPLANT
COVER PROBE W GEL 5X96 (DRAPES) ×2 IMPLANT
DECANTER SPIKE VIAL GLASS SM (MISCELLANEOUS) IMPLANT
DERMABOND ADVANCED (GAUZE/BANDAGES/DRESSINGS) ×1
DERMABOND ADVANCED .7 DNX12 (GAUZE/BANDAGES/DRESSINGS) ×1 IMPLANT
DRAPE LAPAROSCOPIC ABDOMINAL (DRAPES) ×2 IMPLANT
DRAPE UTILITY XL STRL (DRAPES) ×2 IMPLANT
ELECT REM PT RETURN 9FT ADLT (ELECTROSURGICAL) ×2
ELECTRODE REM PT RTRN 9FT ADLT (ELECTROSURGICAL) ×1 IMPLANT
GAUZE SPONGE 4X4 12PLY STRL LF (GAUZE/BANDAGES/DRESSINGS) IMPLANT
GLOVE SURG POLYISO LF SZ6 (GLOVE) ×1 IMPLANT
GLOVE SURG SIGNA 7.5 PF LTX (GLOVE) ×2 IMPLANT
GLOVE SURG UNDER POLY LF SZ7 (GLOVE) ×1 IMPLANT
GOWN STRL REUS W/ TWL LRG LVL3 (GOWN DISPOSABLE) ×1 IMPLANT
GOWN STRL REUS W/ TWL XL LVL3 (GOWN DISPOSABLE) ×1 IMPLANT
GOWN STRL REUS W/TWL LRG LVL3 (GOWN DISPOSABLE) ×2
GOWN STRL REUS W/TWL XL LVL3 (GOWN DISPOSABLE) ×2
KIT MARKER MARGIN INK (KITS) ×2 IMPLANT
NDL HYPO 25X1 1.5 SAFETY (NEEDLE) ×1 IMPLANT
NEEDLE HYPO 25X1 1.5 SAFETY (NEEDLE) ×2 IMPLANT
NS IRRIG 1000ML POUR BTL (IV SOLUTION) IMPLANT
PACK BASIN DAY SURGERY FS (CUSTOM PROCEDURE TRAY) ×2 IMPLANT
PENCIL SMOKE EVACUATOR (MISCELLANEOUS) ×2 IMPLANT
SLEEVE SCD COMPRESS KNEE MED (STOCKING) ×2 IMPLANT
SPONGE T-LAP 4X18 ~~LOC~~+RFID (SPONGE) ×2 IMPLANT
SUT MNCRL AB 4-0 PS2 18 (SUTURE) ×2 IMPLANT
SUT SILK 2 0 SH (SUTURE) IMPLANT
SUT VIC AB 3-0 SH 27 (SUTURE) ×2
SUT VIC AB 3-0 SH 27X BRD (SUTURE) ×1 IMPLANT
SYR CONTROL 10ML LL (SYRINGE) ×2 IMPLANT
TOWEL GREEN STERILE FF (TOWEL DISPOSABLE) ×2 IMPLANT
TRAY FAXITRON CT DISP (TRAY / TRAY PROCEDURE) ×2 IMPLANT
TUBE CONNECTING 20X1/4 (TUBING) IMPLANT
YANKAUER SUCT BULB TIP NO VENT (SUCTIONS) IMPLANT

## 2021-03-07 NOTE — Op Note (Signed)
LEFT BREAST LUMPECTOMY WITH RADIOACTIVE SEED LOCALIZATION  Procedure Note  Stacy King 03/07/2021   Pre-op Diagnosis: COMPLEX SCLEROSING LESON OF LEFT BREAST     Post-op Diagnosis: same  Procedure(s): LEFT BREAST LUMPECTOMY WITH RADIOACTIVE SEED LOCALIZATION  Surgeon(s): Coralie Keens, MD  Anesthesia: General  Staff:  Circulator: Izora Ribas, RN Scrub Person: Romero Liner, CST  Estimated Blood Loss: Minimal               Specimens: sent to path  Indications: This is a 76 year old female who presented with an abnormality on screening mammography of the left breast.  She has had a previous complex grossing lesion on the right breast.  She underwent a biopsy of the left abnormal area showing another complex grossing lesion.  The decision was made to proceed with a radioactive seed guided lumpectomy  Procedure: The patient is brought to operating room identifies a correct patient.  She was placed upon the operating table general anesthesia was induced.  Her left breast was prepped and draped in usual sterile fashion.  Using neoprobe the radioactive seed was located in the upper outer quadrant of the left breast.  I anesthetized the lateral edge of the areola with Marcaine and then made a circumareolar incision with a scalpel.  I then dissected down to the breast tissue and laterally with electrocautery.  With the aid of the neoprobe I was able to identify the radioactive seed in the deep breast tissue.  I performed a lumpectomy with the cautery staying around the radioactive seed after grasping the area with a clamp.  I then completed a lumpectomy coming underneath the specimen with the cautery.  Once I had the lumpectomy specimen removed, I marked all margins with paint.  An x-ray was performed confirming the radioactive seed and previous biopsy clip were in the specimen.  The lumpectomy specimen was then sent to pathology for evaluation.  I achieved hemostasis with the  cautery.  I anesthetized the cavity further with Marcaine.  I then closed the subcutaneous tissue with interrupted 3-0 Vicryl sutures and closed the skin with a running 4-0 Monocryl.  Dermabond was then applied.  The patient tolerated the procedure well.  All the counts were correct at the end of the procedure.  The patient was then extubated in the operating room and taken in a stable condition to the recovery room.          Coralie Keens   Date: 03/07/2021  Time: 8:03 AM

## 2021-03-07 NOTE — Interval H&P Note (Signed)
History and Physical Interval Note:No change in H and P  03/07/2021 7:03 AM  Stacy King  has presented today for surgery, with the diagnosis of COMPLEX SCLEROSING LESON OF LEFT BREAST.  The various methods of treatment have been discussed with the patient and family. After consideration of risks, benefits and other options for treatment, the patient has consented to  Procedure(s): LEFT BREAST LUMPECTOMY WITH RADIOACTIVE SEED LOCALIZATION (Left) as a surgical intervention.  The patient's history has been reviewed, patient examined, no change in status, stable for surgery.  I have reviewed the patient's chart and labs.  Questions were answered to the patient's satisfaction.     Coralie Keens

## 2021-03-07 NOTE — Anesthesia Preprocedure Evaluation (Addendum)
Anesthesia Evaluation  Patient identified by MRN, date of birth, ID band Patient awake    Reviewed: Allergy & Precautions, NPO status , Patient's Chart, lab work & pertinent test results  Airway Mallampati: II  TM Distance: >3 FB Neck ROM: Full    Dental no notable dental hx.    Pulmonary neg pulmonary ROS,    Pulmonary exam normal breath sounds clear to auscultation       Cardiovascular negative cardio ROS Normal cardiovascular exam Rhythm:Regular Rate:Normal     Neuro/Psych negative neurological ROS  negative psych ROS   GI/Hepatic Neg liver ROS, GERD  Controlled,  Endo/Other  negative endocrine ROS  Renal/GU negative Renal ROS  negative genitourinary   Musculoskeletal  (+) Arthritis , Osteoarthritis,    Abdominal (+) + obese,   Peds negative pediatric ROS (+)  Hematology negative hematology ROS (+)   Anesthesia Other Findings   Reproductive/Obstetrics negative OB ROS                             Anesthesia Physical  Anesthesia Plan  ASA: II  Anesthesia Plan: General   Post-op Pain Management:    Induction: Intravenous  PONV Risk Score and Plan: 3 and Ondansetron, Dexamethasone, Midazolam and Treatment may vary due to age or medical condition  Airway Management Planned: LMA  Additional Equipment:   Intra-op Plan:   Post-operative Plan: Extubation in OR  Informed Consent: I have reviewed the patients History and Physical, chart, labs and discussed the procedure including the risks, benefits and alternatives for the proposed anesthesia with the patient or authorized representative who has indicated his/her understanding and acceptance.     Dental advisory given  Plan Discussed with: CRNA  Anesthesia Plan Comments:         Anesthesia Quick Evaluation

## 2021-03-07 NOTE — Discharge Instructions (Addendum)
[facility] °Office Phone Number [phone] ° °BREAST BIOPSY/ PARTIAL MASTECTOMY: POST OP INSTRUCTIONS ° °Always review your discharge instruction sheet given to you by the facility where your surgery was performed. ° °IF YOU HAVE DISABILITY OR FAMILY LEAVE FORMS, YOU MUST BRING THEM TO THE OFFICE FOR PROCESSING.  DO NOT GIVE THEM TO YOUR DOCTOR. ° °A prescription for pain medication may be given to you upon discharge.  Take your pain medication as prescribed, if needed.  If narcotic pain medicine is not needed, then you may take acetaminophen (Tylenol) or ibuprofen (Advil) as needed. °Take your usually prescribed medications unless otherwise directed °If you need a refill on your pain medication, please contact your pharmacy.  They will contact our office to request authorization.  Prescriptions will not be filled after 5pm or on week-ends. °You should eat very light the first 24 hours after surgery, such as soup, crackers, pudding, etc.  Resume your normal diet the day after surgery. °Most patients will experience some swelling and bruising in the breast.  Ice packs and a good support bra will help.  Swelling and bruising can take several days to resolve.  °It is common to experience some constipation if taking pain medication after surgery.  Increasing fluid intake and taking a stool softener will usually help or prevent this problem from occurring.  A mild laxative (Milk of Magnesia or Miralax) should be taken according to package directions if there are no bowel movements after 48 hours. °Unless discharge instructions indicate otherwise, you may remove your bandages 24-48 hours after surgery, and you may shower at that time.  You may have steri-strips (small skin tapes) in place directly over the incision.  These strips should be left on the skin for 7-10 days.  If your surgeon used skin glue on the incision, you may shower in 24 hours.  The glue will flake off over the next 2-3 weeks.  Any  sutures or staples will be removed at the office during your follow-up visit. °ACTIVITIES:  You may resume regular daily activities (gradually increasing) beginning the next day.  Wearing a good support bra or sports bra minimizes pain and swelling.  You may have sexual intercourse when it is comfortable. °You may drive when you no longer are taking prescription pain medication, you can comfortably wear a seatbelt, and you can safely maneuver your car and apply brakes. °RETURN TO WORK:  ______________________________________________________________________________________ °You should see your doctor in the office for a follow-up appointment approximately two weeks after your surgery.  Your doctor’s nurse will typically make your follow-up appointment when she calls you with your pathology report.  Expect your pathology report 2-3 business days after your surgery.  You may call to check if you do not hear from us after three days. °OTHER INSTRUCTIONS: OK TO REMOVE THE BINDER AND SHOWER STARTING TOMORROW °ICE PACK, TYLENOL, AND IBUPROFEN ALSO FOR PAIN °NO VIGOROUS ACTIVITY FOR ONE WEEK °_______________________________________________________________________________________________ _____________________________________________________________________________________________________________________________________ °_____________________________________________________________________________________________________________________________________ °_____________________________________________________________________________________________________________________________________ ° °WHEN TO CALL YOUR DOCTOR: °Fever over 101.0 °Nausea and/or vomiting. °Extreme swelling or bruising. °Continued bleeding from incision. °Increased pain, redness, or drainage from the incision. ° °The clinic staff is available to answer your questions during regular business hours.  Please don’t hesitate to call and ask to speak to one of the  nurses for clinical concerns.  If you have a medical emergency, go to the nearest emergency room or call 911.  A surgeon from [facility] is always on call at the hospital. ° °For   further questions, please visit [email or web address]     Post Anesthesia Home Care Instructions  Activity: Get plenty of rest for the remainder of the day. A responsible individual must stay with you for 24 hours following the procedure.  For the next 24 hours, DO NOT: -Drive a car -Paediatric nurse -Drink alcoholic beverages -Take any medication unless instructed by your physician -Make any legal decisions or sign important papers.  Meals: Start with liquid foods such as gelatin or soup. Progress to regular foods as tolerated. Avoid greasy, spicy, heavy foods. If nausea and/or vomiting occur, drink only clear liquids until the nausea and/or vomiting subsides. Call your physician if vomiting continues.  Special Instructions/Symptoms: Your throat may feel dry or sore from the anesthesia or the breathing tube placed in your throat during surgery. If this causes discomfort, gargle with warm salt water. The discomfort should disappear within 24 hours.  If you had a scopolamine patch placed behind your ear for the management of post- operative nausea and/or vomiting:  1. The medication in the patch is effective for 72 hours, after which it should be removed.  Wrap patch in a tissue and discard in the trash. Wash hands thoroughly with soap and water. 2. You may remove the patch earlier than 72 hours if you experience unpleasant side effects which may include dry mouth, dizziness or visual disturbances. 3. Avoid touching the patch. Wash your hands with soap and water after contact with the patch.      Next dose of Tylenol after 12:40 at home as needed for pain.

## 2021-03-07 NOTE — Transfer of Care (Signed)
Immediate Anesthesia Transfer of Care Note  Patient: Stacy King  Procedure(s) Performed: LEFT BREAST LUMPECTOMY WITH RADIOACTIVE SEED LOCALIZATION (Left: Breast)  Patient Location: PACU  Anesthesia Type:General  Level of Consciousness: drowsy, patient cooperative and responds to stimulation  Airway & Oxygen Therapy: Patient Spontanous Breathing and Patient connected to face mask oxygen  Post-op Assessment: Report given to RN and Post -op Vital signs reviewed and stable  Post vital signs: Reviewed and stable  Last Vitals:  Vitals Value Taken Time  BP    Temp    Pulse 80 03/07/21 0807  Resp    SpO2 98 % 03/07/21 0807  Vitals shown include unvalidated device data.  Last Pain:  Vitals:   03/07/21 0634  TempSrc: Oral  PainSc: 0-No pain         Complications: No notable events documented.

## 2021-03-07 NOTE — Anesthesia Procedure Notes (Signed)
Procedure Name: LMA Insertion Date/Time: 03/07/2021 7:33 AM Performed by: Glory Buff, CRNA Pre-anesthesia Checklist: Patient identified, Emergency Drugs available, Suction available and Patient being monitored Patient Re-evaluated:Patient Re-evaluated prior to induction Oxygen Delivery Method: Circle system utilized Preoxygenation: Pre-oxygenation with 100% oxygen Induction Type: IV induction LMA: LMA inserted LMA Size: 4.0 Number of attempts: 1 Placement Confirmation: positive ETCO2 Tube secured with: Tape Dental Injury: Teeth and Oropharynx as per pre-operative assessment

## 2021-03-07 NOTE — Anesthesia Postprocedure Evaluation (Signed)
Anesthesia Post Note  Patient: Stacy King  Procedure(s) Performed: LEFT BREAST LUMPECTOMY WITH RADIOACTIVE SEED LOCALIZATION (Left: Breast)     Patient location during evaluation: PACU Anesthesia Type: General Level of consciousness: awake and alert Pain management: pain level controlled Vital Signs Assessment: post-procedure vital signs reviewed and stable Respiratory status: spontaneous breathing, nonlabored ventilation and respiratory function stable Cardiovascular status: blood pressure returned to baseline and stable Postop Assessment: no apparent nausea or vomiting Anesthetic complications: no   No notable events documented.  Last Vitals:  Vitals:   03/07/21 0900 03/07/21 0915  BP: (!) 104/57 (!) 117/57  Pulse: 71 80  Resp: 15 20  Temp:    SpO2: (!) 80% 97%    Last Pain:  Vitals:   03/07/21 0915  TempSrc:   PainSc: [name]

## 2021-03-08 ENCOUNTER — Encounter (HOSPITAL_BASED_OUTPATIENT_CLINIC_OR_DEPARTMENT_OTHER): Payer: Self-pay | Admitting: Surgery

## 2021-03-11 LAB — SURGICAL PATHOLOGY

## 2021-03-18 DIAGNOSIS — H40051 Ocular hypertension, right eye: Secondary | ICD-10-CM | POA: Diagnosis not present

## 2021-03-18 DIAGNOSIS — H40013 Open angle with borderline findings, low risk, bilateral: Secondary | ICD-10-CM | POA: Diagnosis not present

## 2021-03-18 DIAGNOSIS — H5203 Hypermetropia, bilateral: Secondary | ICD-10-CM | POA: Diagnosis not present

## 2021-03-18 DIAGNOSIS — H52223 Regular astigmatism, bilateral: Secondary | ICD-10-CM | POA: Diagnosis not present

## 2021-03-18 DIAGNOSIS — H40052 Ocular hypertension, left eye: Secondary | ICD-10-CM | POA: Diagnosis not present

## 2021-03-18 DIAGNOSIS — H524 Presbyopia: Secondary | ICD-10-CM | POA: Diagnosis not present

## 2021-03-18 DIAGNOSIS — H40053 Ocular hypertension, bilateral: Secondary | ICD-10-CM | POA: Diagnosis not present

## 2021-04-02 DIAGNOSIS — D225 Melanocytic nevi of trunk: Secondary | ICD-10-CM | POA: Diagnosis not present

## 2021-04-02 DIAGNOSIS — L918 Other hypertrophic disorders of the skin: Secondary | ICD-10-CM | POA: Diagnosis not present

## 2021-04-02 DIAGNOSIS — L821 Other seborrheic keratosis: Secondary | ICD-10-CM | POA: Diagnosis not present

## 2021-04-02 DIAGNOSIS — L814 Other melanin hyperpigmentation: Secondary | ICD-10-CM | POA: Diagnosis not present

## 2021-04-03 DIAGNOSIS — K760 Fatty (change of) liver, not elsewhere classified: Secondary | ICD-10-CM | POA: Diagnosis not present

## 2021-04-03 DIAGNOSIS — E7800 Pure hypercholesterolemia, unspecified: Secondary | ICD-10-CM | POA: Diagnosis not present

## 2021-04-03 DIAGNOSIS — R7303 Prediabetes: Secondary | ICD-10-CM | POA: Diagnosis not present

## 2021-06-18 ENCOUNTER — Encounter (HOSPITAL_COMMUNITY): Payer: Self-pay

## 2021-06-18 DIAGNOSIS — Z6833 Body mass index (BMI) 33.0-33.9, adult: Secondary | ICD-10-CM | POA: Diagnosis not present

## 2021-06-18 DIAGNOSIS — M25572 Pain in left ankle and joints of left foot: Secondary | ICD-10-CM | POA: Diagnosis not present

## 2021-06-18 DIAGNOSIS — S82832A Other fracture of upper and lower end of left fibula, initial encounter for closed fracture: Secondary | ICD-10-CM | POA: Diagnosis not present

## 2021-06-19 DIAGNOSIS — S82832D Other fracture of upper and lower end of left fibula, subsequent encounter for closed fracture with routine healing: Secondary | ICD-10-CM | POA: Diagnosis not present

## 2021-06-20 DIAGNOSIS — S82832D Other fracture of upper and lower end of left fibula, subsequent encounter for closed fracture with routine healing: Secondary | ICD-10-CM | POA: Diagnosis not present

## 2021-06-24 ENCOUNTER — Ambulatory Visit: Payer: Self-pay | Admitting: Podiatry

## 2021-06-26 DIAGNOSIS — S82832D Other fracture of upper and lower end of left fibula, subsequent encounter for closed fracture with routine healing: Secondary | ICD-10-CM | POA: Diagnosis not present

## 2021-07-10 DIAGNOSIS — S82832D Other fracture of upper and lower end of left fibula, subsequent encounter for closed fracture with routine healing: Secondary | ICD-10-CM | POA: Diagnosis not present

## 2021-07-31 DIAGNOSIS — S82832D Other fracture of upper and lower end of left fibula, subsequent encounter for closed fracture with routine healing: Secondary | ICD-10-CM | POA: Diagnosis not present

## 2021-09-24 DIAGNOSIS — Z86010 Personal history of colonic polyps: Secondary | ICD-10-CM | POA: Diagnosis not present

## 2021-09-24 DIAGNOSIS — K7689 Other specified diseases of liver: Secondary | ICD-10-CM | POA: Diagnosis not present

## 2021-09-25 ENCOUNTER — Other Ambulatory Visit: Payer: Self-pay | Admitting: Gastroenterology

## 2021-09-25 DIAGNOSIS — K7689 Other specified diseases of liver: Secondary | ICD-10-CM

## 2021-10-07 DIAGNOSIS — N959 Unspecified menopausal and perimenopausal disorder: Secondary | ICD-10-CM | POA: Diagnosis not present

## 2021-10-09 ENCOUNTER — Other Ambulatory Visit: Payer: PPO

## 2021-10-21 ENCOUNTER — Ambulatory Visit
Admission: RE | Admit: 2021-10-21 | Discharge: 2021-10-21 | Disposition: A | Discharge status: Home / Self Care | Payer: PPO | Source: Ambulatory Visit | Attending: Gastroenterology | Admitting: Gastroenterology

## 2021-10-21 DIAGNOSIS — N281 Cyst of kidney, acquired: Secondary | ICD-10-CM | POA: Diagnosis not present

## 2021-10-21 DIAGNOSIS — K7689 Other specified diseases of liver: Secondary | ICD-10-CM

## 2021-10-21 DIAGNOSIS — K760 Fatty (change of) liver, not elsewhere classified: Secondary | ICD-10-CM | POA: Diagnosis not present

## 2021-10-21 DIAGNOSIS — D1803 Hemangioma of intra-abdominal structures: Secondary | ICD-10-CM | POA: Diagnosis not present

## 2021-10-21 MED ORDER — GADOBENATE DIMEGLUMINE 529 MG/ML IV SOLN
17.0000 mL | Freq: Once | INTRAVENOUS | Status: AC | PRN
  Administered 2021-10-21: 17 mL via INTRAVENOUS

## 2021-11-07 DIAGNOSIS — R202 Paresthesia of skin: Secondary | ICD-10-CM | POA: Diagnosis not present

## 2021-11-07 DIAGNOSIS — R7303 Prediabetes: Secondary | ICD-10-CM | POA: Diagnosis not present

## 2021-11-07 DIAGNOSIS — Z0000 Encounter for general adult medical examination without abnormal findings: Secondary | ICD-10-CM | POA: Diagnosis not present

## 2021-11-07 DIAGNOSIS — K760 Fatty (change of) liver, not elsewhere classified: Secondary | ICD-10-CM | POA: Diagnosis not present

## 2021-11-07 DIAGNOSIS — Z23 Encounter for immunization: Secondary | ICD-10-CM | POA: Diagnosis not present

## 2021-11-07 DIAGNOSIS — E7800 Pure hypercholesterolemia, unspecified: Secondary | ICD-10-CM | POA: Diagnosis not present

## 2022-02-07 DIAGNOSIS — Z23 Encounter for immunization: Secondary | ICD-10-CM | POA: Diagnosis not present

## 2022-04-02 DIAGNOSIS — L814 Other melanin hyperpigmentation: Secondary | ICD-10-CM | POA: Diagnosis not present

## 2022-04-02 DIAGNOSIS — L821 Other seborrheic keratosis: Secondary | ICD-10-CM | POA: Diagnosis not present

## 2022-04-02 DIAGNOSIS — D225 Melanocytic nevi of trunk: Secondary | ICD-10-CM | POA: Diagnosis not present

## 2022-04-02 DIAGNOSIS — L570 Actinic keratosis: Secondary | ICD-10-CM | POA: Diagnosis not present

## 2022-05-27 DIAGNOSIS — H2511 Age-related nuclear cataract, right eye: Secondary | ICD-10-CM | POA: Diagnosis not present

## 2022-05-27 DIAGNOSIS — H2513 Age-related nuclear cataract, bilateral: Secondary | ICD-10-CM | POA: Diagnosis not present

## 2022-05-27 DIAGNOSIS — H40013 Open angle with borderline findings, low risk, bilateral: Secondary | ICD-10-CM | POA: Diagnosis not present

## 2022-05-27 DIAGNOSIS — H18513 Endothelial corneal dystrophy, bilateral: Secondary | ICD-10-CM | POA: Diagnosis not present

## 2022-05-27 DIAGNOSIS — H25043 Posterior subcapsular polar age-related cataract, bilateral: Secondary | ICD-10-CM | POA: Diagnosis not present

## 2022-05-27 DIAGNOSIS — H25013 Cortical age-related cataract, bilateral: Secondary | ICD-10-CM | POA: Diagnosis not present

## 2022-06-06 ENCOUNTER — Other Ambulatory Visit: Payer: Self-pay | Admitting: Family Medicine

## 2022-06-06 DIAGNOSIS — Z1231 Encounter for screening mammogram for malignant neoplasm of breast: Secondary | ICD-10-CM

## 2022-07-22 ENCOUNTER — Ambulatory Visit: Payer: PPO

## 2022-07-23 ENCOUNTER — Ambulatory Visit
Admission: RE | Admit: 2022-07-23 | Discharge: 2022-07-23 | Disposition: A | Discharge status: Home / Self Care | Payer: PPO | Source: Ambulatory Visit | Attending: Family Medicine | Admitting: Family Medicine

## 2022-07-23 DIAGNOSIS — Z1231 Encounter for screening mammogram for malignant neoplasm of breast: Secondary | ICD-10-CM

## 2022-07-25 ENCOUNTER — Other Ambulatory Visit: Payer: Self-pay | Admitting: Family Medicine

## 2022-07-25 DIAGNOSIS — R928 Other abnormal and inconclusive findings on diagnostic imaging of breast: Secondary | ICD-10-CM

## 2022-08-06 ENCOUNTER — Ambulatory Visit
Admission: RE | Admit: 2022-08-06 | Discharge: 2022-08-06 | Disposition: A | Discharge status: Home / Self Care | Payer: PPO | Source: Ambulatory Visit | Attending: Family Medicine | Admitting: Family Medicine

## 2022-08-06 DIAGNOSIS — R921 Mammographic calcification found on diagnostic imaging of breast: Secondary | ICD-10-CM | POA: Diagnosis not present

## 2022-08-06 DIAGNOSIS — R928 Other abnormal and inconclusive findings on diagnostic imaging of breast: Secondary | ICD-10-CM

## 2022-08-11 DIAGNOSIS — Z9841 Cataract extraction status, right eye: Secondary | ICD-10-CM | POA: Diagnosis not present

## 2022-08-11 DIAGNOSIS — Z961 Presence of intraocular lens: Secondary | ICD-10-CM | POA: Diagnosis not present

## 2022-08-11 DIAGNOSIS — H2511 Age-related nuclear cataract, right eye: Secondary | ICD-10-CM | POA: Diagnosis not present

## 2022-08-12 DIAGNOSIS — H2512 Age-related nuclear cataract, left eye: Secondary | ICD-10-CM | POA: Diagnosis not present

## 2022-09-01 DIAGNOSIS — Z961 Presence of intraocular lens: Secondary | ICD-10-CM | POA: Diagnosis not present

## 2022-09-01 DIAGNOSIS — Z9842 Cataract extraction status, left eye: Secondary | ICD-10-CM | POA: Diagnosis not present

## 2022-09-01 DIAGNOSIS — Z9841 Cataract extraction status, right eye: Secondary | ICD-10-CM | POA: Diagnosis not present

## 2022-09-01 DIAGNOSIS — H2512 Age-related nuclear cataract, left eye: Secondary | ICD-10-CM | POA: Diagnosis not present

## 2022-09-25 DIAGNOSIS — H6121 Impacted cerumen, right ear: Secondary | ICD-10-CM | POA: Diagnosis not present

## 2022-09-25 DIAGNOSIS — H903 Sensorineural hearing loss, bilateral: Secondary | ICD-10-CM | POA: Diagnosis not present

## 2022-11-11 ENCOUNTER — Other Ambulatory Visit: Payer: Self-pay | Admitting: Family Medicine

## 2022-11-11 DIAGNOSIS — E2839 Other primary ovarian failure: Secondary | ICD-10-CM

## 2022-11-11 DIAGNOSIS — E7800 Pure hypercholesterolemia, unspecified: Secondary | ICD-10-CM | POA: Diagnosis not present

## 2022-11-11 DIAGNOSIS — Z0000 Encounter for general adult medical examination without abnormal findings: Secondary | ICD-10-CM | POA: Diagnosis not present

## 2022-11-11 DIAGNOSIS — J301 Allergic rhinitis due to pollen: Secondary | ICD-10-CM | POA: Diagnosis not present

## 2022-11-11 DIAGNOSIS — Z1231 Encounter for screening mammogram for malignant neoplasm of breast: Secondary | ICD-10-CM | POA: Diagnosis not present

## 2022-11-11 DIAGNOSIS — Z1159 Encounter for screening for other viral diseases: Secondary | ICD-10-CM | POA: Diagnosis not present

## 2022-11-11 DIAGNOSIS — R7303 Prediabetes: Secondary | ICD-10-CM | POA: Diagnosis not present

## 2022-12-04 ENCOUNTER — Encounter: Payer: Self-pay | Admitting: Gastroenterology

## 2023-01-02 ENCOUNTER — Other Ambulatory Visit: Payer: Self-pay | Admitting: Family Medicine

## 2023-01-02 DIAGNOSIS — Z1231 Encounter for screening mammogram for malignant neoplasm of breast: Secondary | ICD-10-CM

## 2023-01-22 ENCOUNTER — Encounter: Payer: Self-pay | Admitting: Gastroenterology

## 2023-01-23 ENCOUNTER — Encounter: Payer: Self-pay | Admitting: Gastroenterology

## 2023-01-23 ENCOUNTER — Other Ambulatory Visit: Payer: Self-pay | Admitting: Gastroenterology

## 2023-01-23 DIAGNOSIS — K7689 Other specified diseases of liver: Secondary | ICD-10-CM

## 2023-01-28 DIAGNOSIS — Z23 Encounter for immunization: Secondary | ICD-10-CM | POA: Diagnosis not present

## 2023-02-09 ENCOUNTER — Ambulatory Visit
Admission: RE | Admit: 2023-02-09 | Discharge: 2023-02-09 | Disposition: A | Discharge status: Home / Self Care | Payer: PPO | Source: Ambulatory Visit | Attending: Gastroenterology

## 2023-02-09 DIAGNOSIS — K7689 Other specified diseases of liver: Secondary | ICD-10-CM

## 2023-02-09 DIAGNOSIS — I700 Atherosclerosis of aorta: Secondary | ICD-10-CM | POA: Diagnosis not present

## 2023-02-09 MED ORDER — IOPAMIDOL (ISOVUE-300) INJECTION 61%
500.0000 mL | Freq: Once | INTRAVENOUS | Status: AC | PRN
  Administered 2023-02-09: 100 mL via INTRAVENOUS

## 2023-04-06 DIAGNOSIS — L814 Other melanin hyperpigmentation: Secondary | ICD-10-CM | POA: Diagnosis not present

## 2023-04-06 DIAGNOSIS — L821 Other seborrheic keratosis: Secondary | ICD-10-CM | POA: Diagnosis not present

## 2023-04-06 DIAGNOSIS — D225 Melanocytic nevi of trunk: Secondary | ICD-10-CM | POA: Diagnosis not present

## 2023-07-25 ENCOUNTER — Ambulatory Visit
Admission: RE | Admit: 2023-07-25 | Discharge: 2023-07-25 | Disposition: A | Discharge status: Home / Self Care | Source: Ambulatory Visit | Attending: Family Medicine | Admitting: Family Medicine

## 2023-07-25 DIAGNOSIS — M810 Age-related osteoporosis without current pathological fracture: Secondary | ICD-10-CM | POA: Diagnosis not present

## 2023-07-25 DIAGNOSIS — Z1231 Encounter for screening mammogram for malignant neoplasm of breast: Secondary | ICD-10-CM | POA: Diagnosis not present

## 2023-07-25 DIAGNOSIS — E2839 Other primary ovarian failure: Secondary | ICD-10-CM

## 2023-07-27 ENCOUNTER — Ambulatory Visit: Payer: PPO

## 2023-07-27 ENCOUNTER — Other Ambulatory Visit: Payer: PPO

## 2023-07-30 ENCOUNTER — Other Ambulatory Visit: Payer: Self-pay | Admitting: Family Medicine

## 2023-07-30 DIAGNOSIS — R928 Other abnormal and inconclusive findings on diagnostic imaging of breast: Secondary | ICD-10-CM

## 2023-08-12 ENCOUNTER — Ambulatory Visit
Admission: RE | Admit: 2023-08-12 | Discharge: 2023-08-12 | Disposition: A | Discharge status: Home / Self Care | Source: Ambulatory Visit | Attending: Family Medicine | Admitting: Family Medicine

## 2023-08-12 ENCOUNTER — Ambulatory Visit
Admission: RE | Admit: 2023-08-12 | Discharge: 2023-08-12 | Disposition: A | Discharge status: Home / Self Care | Source: Ambulatory Visit | Attending: Family Medicine

## 2023-08-12 DIAGNOSIS — R928 Other abnormal and inconclusive findings on diagnostic imaging of breast: Secondary | ICD-10-CM

## 2023-08-21 DIAGNOSIS — J301 Allergic rhinitis due to pollen: Secondary | ICD-10-CM | POA: Diagnosis not present

## 2023-08-21 DIAGNOSIS — R42 Dizziness and giddiness: Secondary | ICD-10-CM | POA: Diagnosis not present

## 2023-08-21 DIAGNOSIS — R7303 Prediabetes: Secondary | ICD-10-CM | POA: Diagnosis not present

## 2023-09-24 ENCOUNTER — Encounter (INDEPENDENT_AMBULATORY_CARE_PROVIDER_SITE_OTHER): Payer: Self-pay | Admitting: Otolaryngology

## 2023-09-24 ENCOUNTER — Ambulatory Visit (INDEPENDENT_AMBULATORY_CARE_PROVIDER_SITE_OTHER): Payer: PPO | Admitting: Otolaryngology

## 2023-09-24 VITALS — BP 122/75 | HR 76 | Ht 62.5 in | Wt 179.0 lb

## 2023-09-24 DIAGNOSIS — H903 Sensorineural hearing loss, bilateral: Secondary | ICD-10-CM | POA: Diagnosis not present

## 2023-09-24 DIAGNOSIS — R42 Dizziness and giddiness: Secondary | ICD-10-CM

## 2023-09-27 DIAGNOSIS — H903 Sensorineural hearing loss, bilateral: Secondary | ICD-10-CM | POA: Insufficient documentation

## 2023-09-27 DIAGNOSIS — R42 Dizziness and giddiness: Secondary | ICD-10-CM | POA: Insufficient documentation

## 2023-09-27 NOTE — Progress Notes (Signed)
 Patient ID: RIANNON MUKHERJEE, female   DOB: [birth year], 78 y.o.   MRN: [id number]  Follow-up: Hearing loss New complaint: Recurrent dizziness  HPI: The patient is a 78 year old female who presents today with a new complaint of recurrent dizziness.  The patient was previously seen for bilateral sensorineural hearing loss.  The patient returns today reporting no significant change in his hearing.  However, she has been having intermittent lightheaded and off-balance sensation.  Each episode typically last for several minutes.  She denies any spinning vertigo.  Currently she denies any otalgia, otorrhea, tinnitus, or vertigo.  Exam: General: Communicates without difficulty, well nourished, no acute distress. Head: Normocephalic, no evidence injury, no tenderness, facial buttresses intact without stepoff. Face/sinus: No tenderness to palpation and percussion. Facial movement is normal and symmetric. Eyes: PERRL, EOMI. No scleral icterus, conjunctivae clear. Neuro: CN II exam reveals vision grossly intact.  No nystagmus at any point of gaze. Ears: Auricles well formed without lesions.  Ear canals are intact without mass or lesion.  No erythema or edema is appreciated.  The TMs are intact without fluid. Nose: External evaluation reveals normal support and skin without lesions.  Dorsum is intact.  Anterior rhinoscopy reveals normal mucosa over anterior aspect of inferior turbinates and intact septum.  No purulence noted. Oral:  Oral cavity and oropharynx are intact, symmetric, without erythema or edema.  Mucosa is moist without lesions. Neck: Full range of motion without pain.  There is no significant lymphadenopathy.  No masses palpable.  Thyroid  bed within normal limits to palpation.  Parotid glands and submandibular glands equal bilaterally without mass.  Trachea is midline. Neuro:  CN 2-12 grossly intact. Vestibular: No nystagmus at any point of gaze. Dix Hallpike negative. Vestibular: There is no nystagmus with  pneumatic pressure on either tympanic membrane or Valsalva. The cerebellar examination is unremarkable.   Assessment: 1.  Recurrent dizziness of unknown etiology. The possible differential diagnoses include transient BPPV, vestibular migraine, Meniere's disease, peripheral vestibular dysfunction, or other central/systemic causes.   2.  Subjectively stable bilateral high-frequency sensorineural hearing loss.  Plan: 1.  The physical exam findings are reviewed with the patient. 2.  The pathophysiology of vestibular dysfunction and dizziness are discussed extensively with the patient. The possible differential diagnoses are reviewed. Questions are invited and answered.   3.  If the dizziness persists or worsen, she may benefit from undergoing physical therapy/vestibular rehabilitation to improve the balancing function.   4.  The patient will return for reevaluation in 1 year, sooner if needed.

## 2023-12-04 DIAGNOSIS — Z1331 Encounter for screening for depression: Secondary | ICD-10-CM | POA: Diagnosis not present

## 2023-12-04 DIAGNOSIS — Z0000 Encounter for general adult medical examination without abnormal findings: Secondary | ICD-10-CM | POA: Diagnosis not present

## 2023-12-04 DIAGNOSIS — Z23 Encounter for immunization: Secondary | ICD-10-CM | POA: Diagnosis not present

## 2023-12-04 DIAGNOSIS — E7800 Pure hypercholesterolemia, unspecified: Secondary | ICD-10-CM | POA: Diagnosis not present

## 2023-12-04 DIAGNOSIS — M85859 Other specified disorders of bone density and structure, unspecified thigh: Secondary | ICD-10-CM | POA: Diagnosis not present

## 2023-12-04 DIAGNOSIS — R7303 Prediabetes: Secondary | ICD-10-CM | POA: Diagnosis not present
